# Patient Record
Sex: Female | Born: 1975 | Hispanic: Yes | Marital: Married | Smoking: Never smoker
Health system: Southern US, Community
[De-identification: ages and names within clinical notes are randomized; demographics above are authoritative.]

## PROBLEM LIST (undated history)

## (undated) DIAGNOSIS — F419 Anxiety disorder, unspecified: Secondary | ICD-10-CM

## (undated) DIAGNOSIS — N858 Other specified noninflammatory disorders of uterus: Secondary | ICD-10-CM

## (undated) DIAGNOSIS — G43909 Migraine, unspecified, not intractable, without status migrainosus: Secondary | ICD-10-CM

## (undated) DIAGNOSIS — D649 Anemia, unspecified: Secondary | ICD-10-CM

## (undated) DIAGNOSIS — E559 Vitamin D deficiency, unspecified: Secondary | ICD-10-CM

## (undated) HISTORY — DX: Vitamin D deficiency, unspecified: E55.9

## (undated) HISTORY — DX: Migraine, unspecified, not intractable, without status migrainosus: G43.909

## (undated) HISTORY — DX: Anxiety disorder, unspecified: F41.9

## (undated) HISTORY — DX: Other specified noninflammatory disorders of uterus: N85.8

## (undated) HISTORY — PX: INTRAUTERINE DEVICE (IUD) INSERTION: SHX5877

## (undated) HISTORY — DX: Anemia, unspecified: D64.9

---

## 2000-04-22 ENCOUNTER — Inpatient Hospital Stay (HOSPITAL_COMMUNITY): Admission: AD | Admit: 2000-04-22 | Discharge: 2000-04-22 | Payer: Self-pay | Admitting: Obstetrics

## 2000-04-22 ENCOUNTER — Encounter: Payer: Self-pay | Admitting: Obstetrics

## 2000-04-23 ENCOUNTER — Ambulatory Visit (HOSPITAL_COMMUNITY): Admission: RE | Admit: 2000-04-23 | Discharge: 2000-04-23 | Payer: Self-pay | Admitting: Obstetrics & Gynecology

## 2000-04-23 ENCOUNTER — Encounter (INDEPENDENT_AMBULATORY_CARE_PROVIDER_SITE_OTHER): Payer: Self-pay

## 2000-11-26 ENCOUNTER — Other Ambulatory Visit: Admission: RE | Admit: 2000-11-26 | Discharge: 2000-11-26 | Payer: Self-pay | Admitting: *Deleted

## 2001-06-18 ENCOUNTER — Inpatient Hospital Stay (HOSPITAL_COMMUNITY): Admission: AD | Admit: 2001-06-18 | Discharge: 2001-06-20 | Payer: Self-pay | Admitting: *Deleted

## 2003-03-12 ENCOUNTER — Emergency Department (HOSPITAL_COMMUNITY): Admission: EM | Admit: 2003-03-12 | Discharge: 2003-03-12 | Payer: Self-pay

## 2003-03-17 ENCOUNTER — Other Ambulatory Visit: Admission: RE | Admit: 2003-03-17 | Discharge: 2003-03-17 | Payer: Self-pay | Admitting: Obstetrics and Gynecology

## 2003-10-23 ENCOUNTER — Inpatient Hospital Stay (HOSPITAL_COMMUNITY): Admission: AD | Admit: 2003-10-23 | Discharge: 2003-10-25 | Payer: Self-pay | Admitting: Obstetrics and Gynecology

## 2005-06-02 ENCOUNTER — Encounter (INDEPENDENT_AMBULATORY_CARE_PROVIDER_SITE_OTHER): Payer: Self-pay | Admitting: Internal Medicine

## 2005-06-02 ENCOUNTER — Ambulatory Visit: Payer: Self-pay | Admitting: Internal Medicine

## 2006-01-20 ENCOUNTER — Ambulatory Visit: Payer: Self-pay | Admitting: Internal Medicine

## 2006-02-23 ENCOUNTER — Ambulatory Visit: Payer: Self-pay | Admitting: Internal Medicine

## 2006-04-24 ENCOUNTER — Encounter (INDEPENDENT_AMBULATORY_CARE_PROVIDER_SITE_OTHER): Payer: Self-pay | Admitting: Internal Medicine

## 2006-06-01 ENCOUNTER — Ambulatory Visit: Payer: Self-pay | Admitting: Hospitalist

## 2006-06-01 ENCOUNTER — Encounter (INDEPENDENT_AMBULATORY_CARE_PROVIDER_SITE_OTHER): Payer: Self-pay | Admitting: Internal Medicine

## 2006-06-01 LAB — CONVERTED CEMR LAB
ALT: 9 units/L (ref 0–35)
AST: 13 units/L (ref 0–37)
Albumin: 4.4 g/dL (ref 3.5–5.2)
Alkaline Phosphatase: 45 units/L (ref 39–117)
BUN: 11 mg/dL (ref 6–23)
Basophils Absolute: 0 10*3/uL (ref 0.0–0.1)
Basophils Relative: 0 % (ref 0–1)
CO2: 24 meq/L (ref 19–32)
Calcium: 8.9 mg/dL (ref 8.4–10.5)
Chlamydia, DNA Probe: NEGATIVE
Chloride: 107 meq/L (ref 96–112)
Creatinine, Ser: 0.62 mg/dL (ref 0.40–1.20)
Eosinophils Absolute: 0.2 10*3/uL (ref 0.0–0.7)
Eosinophils Relative: 2 % (ref 0–5)
GC Probe Amp, Genital: NEGATIVE
Glucose, Bld: 89 mg/dL (ref 70–99)
HCT: 34.2 % — ABNORMAL LOW (ref 36.0–46.0)
Hemoglobin: 11.7 g/dL — ABNORMAL LOW (ref 12.0–15.0)
Lymphocytes Relative: 29 % (ref 12–46)
Lymphs Abs: 2.8 10*3/uL (ref 0.7–3.3)
MCHC: 34.2 g/dL (ref 30.0–36.0)
MCV: 82.6 fL (ref 78.0–100.0)
Monocytes Absolute: 0.7 10*3/uL (ref 0.2–0.7)
Monocytes Relative: 7 % (ref 3–11)
Neutro Abs: 5.9 10*3/uL (ref 1.7–7.7)
Neutrophils Relative %: 61 % (ref 43–77)
Platelets: 317 10*3/uL (ref 150–400)
Potassium: 3.6 meq/L (ref 3.5–5.3)
RBC: 4.14 M/uL (ref 3.87–5.11)
RDW: 13 % (ref 11.5–14.0)
Sodium: 140 meq/L (ref 135–145)
Total Bilirubin: 0.4 mg/dL (ref 0.3–1.2)
Total Protein: 7.1 g/dL (ref 6.0–8.3)
WBC: 9.7 10*3/uL (ref 4.0–10.5)

## 2006-06-03 LAB — CONVERTED CEMR LAB
Candida species: NEGATIVE
Gardnerella vaginalis: POSITIVE — AB
Trichomonal Vaginitis: NEGATIVE

## 2007-06-03 ENCOUNTER — Encounter (INDEPENDENT_AMBULATORY_CARE_PROVIDER_SITE_OTHER): Payer: Self-pay | Admitting: Internal Medicine

## 2007-06-03 LAB — CONVERTED CEMR LAB: Pap Smear: NORMAL

## 2007-08-06 ENCOUNTER — Ambulatory Visit: Payer: Self-pay | Admitting: Hospitalist

## 2007-08-06 DIAGNOSIS — K089 Disorder of teeth and supporting structures, unspecified: Secondary | ICD-10-CM | POA: Insufficient documentation

## 2007-09-10 ENCOUNTER — Ambulatory Visit: Payer: Self-pay | Admitting: Internal Medicine

## 2008-06-22 ENCOUNTER — Encounter (INDEPENDENT_AMBULATORY_CARE_PROVIDER_SITE_OTHER): Payer: Self-pay | Admitting: Internal Medicine

## 2008-08-16 LAB — HM MAMMOGRAPHY

## 2008-08-28 ENCOUNTER — Ambulatory Visit: Payer: Self-pay | Admitting: Infectious Disease

## 2008-08-28 DIAGNOSIS — F411 Generalized anxiety disorder: Secondary | ICD-10-CM

## 2008-08-30 ENCOUNTER — Encounter (INDEPENDENT_AMBULATORY_CARE_PROVIDER_SITE_OTHER): Payer: Self-pay | Admitting: Internal Medicine

## 2008-09-27 ENCOUNTER — Ambulatory Visit: Payer: Self-pay | Admitting: Internal Medicine

## 2009-06-04 ENCOUNTER — Ambulatory Visit: Payer: Self-pay | Admitting: Internal Medicine

## 2009-09-03 ENCOUNTER — Encounter: Payer: Self-pay | Admitting: Internal Medicine

## 2009-10-11 ENCOUNTER — Ambulatory Visit: Payer: Self-pay | Admitting: Internal Medicine

## 2009-10-16 ENCOUNTER — Ambulatory Visit: Payer: Self-pay | Admitting: Internal Medicine

## 2009-10-16 LAB — CONVERTED CEMR LAB
ALT: 12 units/L (ref 0–35)
AST: 12 units/L (ref 0–37)
Alkaline Phosphatase: 44 units/L (ref 39–117)
Chloride: 105 meq/L (ref 96–112)
Creatinine, Ser: 0.72 mg/dL (ref 0.40–1.20)
HCT: 35.8 % — ABNORMAL LOW (ref 36.0–46.0)
Hgb A1c MFr Bld: 5.6 %
LDL Cholesterol: 60 mg/dL (ref 0–99)
MCV: 83.1 fL (ref >=78.0)
Platelets: 295 10*3/uL (ref 150–400)
RDW: 13.9 % (ref 11.5–15.5)
TSH: 2.005 microintl units/mL (ref 0.350–4.5)
Total Bilirubin: 0.5 mg/dL (ref 0.3–1.2)
Total CHOL/HDL Ratio: 2.7
VLDL: 19 mg/dL (ref 0–40)

## 2010-05-07 ENCOUNTER — Emergency Department (HOSPITAL_COMMUNITY)
Admission: EM | Admit: 2010-05-07 | Discharge: 2010-05-07 | Payer: Self-pay | Source: Home / Self Care | Admitting: Family Medicine

## 2010-06-04 NOTE — Assessment & Plan Note (Signed)
Summary: PT COMING @415  FOR FLU SHOT/CH  Nurse Visit   Allergies: 1)  ! Penicillin  Immunizations Administered:  Influenza Vaccine # 1:    Vaccine Type: Fluvax Non-MCR    Site: left deltoid    Mfr: Novartis    Dose: 0.5 ml    Route: IM    Given by: Angelina Ok RN    Exp. Date: 08/2009    Lot #: [id number]    VIS given: 11/26/06 version given June 04, 2009.  Flu Vaccine Consent Questions:    Do you have a history of severe allergic reactions to this vaccine? no    Any prior history of allergic reactions to egg and/or gelatin? no    Do you have a sensitivity to the preservative Thimersol? no    Do you have a past history of Guillan-Barre Syndrome? no    Do you currently have an acute febrile illness? no    Have you ever had a severe reaction to latex? no    Vaccine information given and explained to patient? yes    Are you currently pregnant? no  Orders Added: 1)  Influenza Vaccine NON MCR [00028]

## 2010-06-04 NOTE — Assessment & Plan Note (Signed)
Summary: RA/NEEDS YEARLY CHECKUP/CH   Vital Signs:  Patient profile:   35 year old female Height:      59 inches (149.86 cm) Weight:      118.3 pounds (53.77 kg) BMI:     23.98 Temp:     98.3 degrees F (36.83 degrees C) oral Pulse rate:   82 / minute BP sitting:   100 / 59  (right arm) Cuff size:   regular  Vitals Entered By: Cynda Familia Duncan Dull) (October 11, 2009 3:55 PM) CC: "yearly check-up" Is Patient Diabetic? No Pain Assessment Patient in pain? no      Nutritional Status BMI of 19 -24 = normal  Have you ever been in a relationship where you felt threatened, hurt or afraid?No   Does patient need assistance? Functional Status Self care Ambulation Normal   Primary Care Provider:  Peggye Pitt MD  CC:  "yearly check-up".  History of Present Illness: 35 yr old woman with pmhx as described below comes to the clinic for regular check up. Patient reports to have episodes of chest tightness that are experienced 1-2 times per week. Associated with shortness of breath, and  palpitations. Episodes last for around 2 minutes. Patient feels like she is going to die when she gets episodes. Patient had these episodes in the past which got better while she was taking paroxetine. Patient stopped taking paroxetine 6 months ago. Episodes restarted 4 months ago. Patient states that she was walks atleast 3 times per week but never gets pain on walking. It is always at rest. Patient worries alot and reports that she is in stress. She has 3 children and she feels stressed out because she does not have any time for herself. Denies smoking, alcohol or drug use.   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 Take 1 Tablet By Mouth Once A Day  Current Medications (verified): 1)  Zoloft 25 Mg Tabs (Sertraline Hcl) .... Take 1 Tablet By Mouth Once A Day  Allergies: 1)  ! Penicillin  Past History:  Past Medical History: Last updated: 06/01/2006 Headache, migraine  Risk Factors: Exercise: yes (08/28/2008)  Risk Factors: Smoking Status: never (10/11/2009)  Review of Systems  The patient denies fever, headaches, hemoptysis, abdominal pain, melena, hematochezia, severe indigestion/heartburn, hematuria, muscle weakness, difficulty walking, and unusual weight change.    Physical Exam  General:  Well-developed,well-nourished,in no acute distress; alert,appropriate and cooperative throughout examination Mouth:  Oral mucosa and oropharynx without lesions or exudates.  Teeth in good repair. Neck:  No bruits/goiter Lungs:  Normal respiratory effort, chest expands symmetrically. Lungs are clear to auscultation, no crackles or wheezes. Heart:  Normal rate and regular rhythm. S1 and S2 normal without gallop, murmur, click, rub or other extra sounds. Abdomen:  Bowel sounds positive,abdomen soft and non-tender without masses, organomegaly or hernias noted. Msk:  normal ROM.   Extremities:  No clubbing, cyanosis, edema. Neurologic:  alert & oriented X3, cranial nerves II-XII intact, strength normal in all extremities, sensation intact to light touch, sensation intact to pinprick, gait normal, and DTRs symmetrical and normal.   Psych:  moderately anxious.     Impression & Recommendations:  Problem # 1:  GENERALIZED ANXIETY DISORDER (ICD-300.02) Chest pain  most likely related to panic attack 2/2 GAD. No risk factors for CAD. Will start patient on Zoloft. Reasses in one month.   The following medications were removed from the medication list:    Paroxetine Hcl 20 Mg Tabs (Paroxetine hcl) .Marland Kitchen... Take 1 tablet by mouth once a day Her updated medication list for this problem  includes:    Zoloft 25 Mg Tabs (Sertraline hcl) .Marland Kitchen... Take 1 tablet by mouth once a day  Orders: T-CMP with Estimated GFR ([id number]) T-Comprehensive Metabolic Panel ([id number])Future Orders: T-TSH ([id number]) ... 10/12/2009 T-CBC No Diff ([id number]) ... 10/12/2009  Problem # 2:  HEALTH SCREENING (ICD-V70.0) Check labs and reasses. Patient had normal Pap smear at [facility]. Will get records.   Complete Medication List: 1)  Zoloft 25 Mg Tabs (Sertraline hcl) .... Take 1 tablet by mouth once a day  Other Orders: Future Orders: T-Lipid Profile ([id number]) ... 10/12/2009 T-Hgb A1C (in-house) [id number]) ... 10/12/2009  Patient Instructions: 1)  Please schedule a follow-up appointment in 2 months. 2)  You will be called with any abnormalities in the tests scheduled or performed today.  If you don't hear from [place] within a week from when the test was performed, you can assume that your test was normal.  Prescriptions: ZOLOFT 25 MG TABS (SERTRALINE HCL) Take 1 tablet by mouth once a day  #30 x 3   Entered and Authorized by:   Laren Everts MD   Signed by:   Laren Everts MD on 10/11/2009   Method used:   Print then Give to Patient   RxID:   [id number]  Process Orders Check Orders Results:     [facility]: ABN not required for this insurance Tests Sent for requisitioning (October 16, 2009 2:28 PM):     10/12/2009: [facility] -- T-Lipid Profile [phone] (signed)     10/12/2009: [facility] -- T-TSH [phone] (signed)     10/12/2009: [facility] -- T-CBC No Diff [[id number]] (signed)     10/11/2009: [facility] -- T-Comprehensive Metabolic Panel [phone] (signed)    Process Orders Check Orders Results:     [facility]: ABN not required for this insurance Tests Sent for requisitioning (October 16, 2009 2:28 PM):     10/12/2009:  [facility] -- T-Lipid Profile [phone] (signed)     10/12/2009: [facility] -- T-TSH [phone] (signed)     10/12/2009: [facility] -- T-CBC No Diff [[id number]] (signed)     10/11/2009: [facility] -- T-Comprehensive Metabolic Panel [phone] (signed)

## 2010-06-04 NOTE — Letter (Signed)
[facility]   Imported By: Margie Billet 10/19/2009 10:56:27  _____________________________________________________________________  External Attachment:    Type:   Image     Comment:   External Document  Appended Document: [facility] Pap smear normal.

## 2010-09-14 ENCOUNTER — Encounter: Payer: Self-pay | Admitting: Internal Medicine

## 2010-09-20 NOTE — Op Note (Signed)
[facility]  Patient:    Lisa Villarreal, Lisa Villarreal Visit Number: [id number] MRN: [id number]          Service Type: OBS Location: [id number] Attending Physician:  Michaelle Copas Dictated by:   Lenoard Aden, M.D. Admit Date:  06/18/2001                             Operative Report  INDICATIONS:                  Indications for operative vaginal delivery was nonreassuring fetal heart rate tracing with persistent variable/deep decelerations after prolonged expulsive efforts noted, and due to the aforementioned indications, a Mityvac mushroom cup is offered to the patient and her husband.  The risks and benefits of vacuum-assisted vaginal delivery discussed.  They acknowledge and desired [place] to proceed.  DELIVERY NOTE:                Under appropriate epidural anesthesia, after emptying the bladder with the red rubber catheter, a Mityvac mushroom cup is placed to the vertex, left occiput anterior position, less than 45 degrees at +3 station for four pulls over a central median episiotomy of a full-term living female, Apgars 8 and 9.  Mild shoulder dystocia relieved with Roberts maneuver and suprapubic pressure.  Placenta was delivered spontaneously intact. Three-vessel cord noted.  Cervix is inspected and found to be without lacerations.  A small right lateral vaginal wall laceration was repaired using 2-0 Vicryl repeat.  Episiotomy repaired using a 2-0 repeat in the standard fashion.  Good hemostasis is noted.  Mother and baby are doing well and recovery in good condition. Dictated by:   Lenoard Aden, M.D. Attending Physician:  Michaelle Copas DD:  06/19/01 TD:  06/19/01 Job: [id number]

## 2010-09-20 NOTE — Op Note (Signed)
[facility]  Patient:    Lisa Villarreal, Lisa Villarreal                       MRN: [id number] Proc. Date: 04/24/00 Adm. Date:  [id number] Disc. Date: [id number] Attending:  Antionette Char CC:         [facility]   Operative Report  PREOPERATIVE DIAGNOSIS:       Embryotic demise.  POSTOPERATIVE DIAGNOSIS:      Embryotic demise.  PROCEDURE:                    Suction dilation and curettage.  SURGEON:                      Roseanna Rainbow, M.D.  ANESTHESIA:                   Paracervical block, managed anesthesia care.  COMPLICATIONS:                None.  ESTIMATED BLOOD LOSS:         50 cc.  FINDINGS:                     Eight week size anteverted uterus.  Moderate products of conception.  DESCRIPTION OF PROCEDURE:     The patient was taken to the operating room.  A ______ speculum was placed in the patients vagina.  The cervix and vagina were swabbed with Betadine, and 2 cc of lidocaine were then injected into the anterior lip of the cervix.  Lidocaine 2 cc were then injected in all four quadrants to produce a paracervical block.  A total of 10 cc was used in all. The single-tooth tenaculum was applied to the anterior lip of the cervix.  A 7 mm suction curette was then advanced gently into the uterine fundus after the cervix had been dilated with Shawnie Pons dilators.  The suction device was then activated and the curette rotated to clear the uterus of the products of conception.  A sharp curettage was then performed until a gritty texture was noted.  The suction curette was then introduced to clear the uterus of the remaining products of conception.  There was minimal bleeding noted and the tenaculum removed with good hemostasis.  The patient tolerated the procedure well.  The patient was taken to the PACU in stable condition.  PATHOLOGY:                    Products of conception. DD:  04/24/00 TD:  04/26/00 Job:  [id number]

## 2010-10-24 ENCOUNTER — Ambulatory Visit (INDEPENDENT_AMBULATORY_CARE_PROVIDER_SITE_OTHER): Payer: Self-pay | Admitting: Internal Medicine

## 2010-10-24 ENCOUNTER — Encounter: Payer: Self-pay | Admitting: Internal Medicine

## 2010-10-24 DIAGNOSIS — Z0000 Encounter for general adult medical examination without abnormal findings: Secondary | ICD-10-CM

## 2010-10-24 DIAGNOSIS — B351 Tinea unguium: Secondary | ICD-10-CM | POA: Insufficient documentation

## 2010-10-24 LAB — COMPREHENSIVE METABOLIC PANEL
ALT: 13 U/L (ref 0–35)
AST: 16 U/L (ref 0–37)
Creat: 0.7 mg/dL (ref 0.50–1.10)
Total Bilirubin: 0.4 mg/dL (ref 0.3–1.2)

## 2010-10-24 LAB — CBC
MCV: 82.4 fL (ref 78.0–100.0)
Platelets: 336 10*3/uL (ref 150–400)
RDW: 13.4 % (ref 11.5–15.5)
WBC: 9.5 10*3/uL (ref 4.0–10.5)

## 2010-10-24 LAB — LIPID PANEL
Cholesterol: 152 mg/dL (ref 0–200)
LDL Cholesterol: 55 mg/dL (ref 0–99)
Triglycerides: 248 mg/dL — ABNORMAL HIGH (ref <150)

## 2010-10-24 MED ORDER — TERBINAFINE HCL 250 MG PO TABS: 250.0000 mg | ORAL_TABLET | Freq: Every day | ORAL | Status: AC

## 2010-10-24 NOTE — Patient Instructions (Signed)
Please make a follow up appointment in 3 months. Regrese in 3 mezes. No puede tomar bebidas al mismo tiempo que esta tomando nuevo medicamiento.

## 2010-10-25 ENCOUNTER — Encounter: Payer: Self-pay | Admitting: Internal Medicine

## 2010-10-25 NOTE — Assessment & Plan Note (Signed)
Will start patient on 3 month course of lamisil for onychomycosis. Patient denies any alcohol consumption. Will check liver enzymes. Patient was educated on the risk of drinking alcohol while taking medication. Patient voices understanding. Will have patient follow up in 3 months to recheck liver enzymes.

## 2010-10-25 NOTE — Assessment & Plan Note (Signed)
Made a copy of Pap smear for our records. Check lipid panel although not fasting, and TSH.

## 2010-10-25 NOTE — Progress Notes (Signed)
  Subjective:    Patient ID: Lisa Villarreal, female    DOB: 1975/05/11, 35 y.o.   MRN: [id number]  HPI  35 yr old woman with no significant past medical history comes to the clinic for annual exam. Patient has no complains other than fungal infection on toes.   Patient reports to have gone to [facility] and got a Pap smear which was normal. Patient brought a copy of pap smear today.    Review of Systems  All other systems reviewed and are negative.       Objective:   Physical Exam  Constitutional: She is oriented to person, place, and time. She appears well-developed and well-nourished.  HENT:  Mouth/Throat: Oropharynx is clear and moist.  Eyes: Conjunctivae and EOM are normal. Pupils are equal, round, and reactive to light.  Neck: Normal range of motion. Neck supple.  Cardiovascular: Normal rate, regular rhythm and normal heart sounds.   Pulmonary/Chest: Effort normal and breath sounds normal.  Abdominal: Soft. Bowel sounds are normal.  Musculoskeletal: Normal range of motion.  Neurological: She is alert and oriented to person, place, and time. No cranial nerve deficit. Coordination normal.  Psychiatric: She has a normal mood and affect.          Assessment & Plan:

## 2011-03-06 ENCOUNTER — Encounter: Payer: Self-pay | Admitting: Internal Medicine

## 2011-03-20 ENCOUNTER — Ambulatory Visit (INDEPENDENT_AMBULATORY_CARE_PROVIDER_SITE_OTHER): Payer: Self-pay | Admitting: Internal Medicine

## 2011-03-20 ENCOUNTER — Encounter: Payer: Self-pay | Admitting: Internal Medicine

## 2011-03-20 VITALS — BP 116/65 | HR 82 | Temp 97.5°F | Ht 59.5 in | Wt 114.5 lb

## 2011-03-20 DIAGNOSIS — G5700 Lesion of sciatic nerve, unspecified lower limb: Secondary | ICD-10-CM | POA: Insufficient documentation

## 2011-03-20 DIAGNOSIS — Z23 Encounter for immunization: Secondary | ICD-10-CM

## 2011-03-20 DIAGNOSIS — Z79899 Other long term (current) drug therapy: Secondary | ICD-10-CM

## 2011-03-20 DIAGNOSIS — F1910 Other psychoactive substance abuse, uncomplicated: Secondary | ICD-10-CM

## 2011-03-20 MED ORDER — IBUPROFEN 800 MG PO TABS: 800.0000 mg | ORAL_TABLET | Freq: Three times a day (TID) | ORAL | Status: AC | PRN

## 2011-03-20 NOTE — Assessment & Plan Note (Signed)
She complains of pain in her deep right gluteal muscle and right lower back, that extends down her right quadriceps. This began intermittently approximately 2 months ago but over the past 3 weeks has become continuous.  It has been unresponsive to 400 mg of Advil.  Symptoms on physical exam is consistent with piriformis syndrome. I will increase ibuprofen to 800 mg Q8 hours when necessary for pain and schedule referral to physical therapy. I gave printed instructions on exercises to help alleviate the discomfort from the piriformis syndrome. If this resolves her pain she will followup with me for annual physical exam in May 2013.

## 2011-03-20 NOTE — Patient Instructions (Addendum)
It was nice to meet you today.  You received the flu vaccination today. We will check your kidney function today as you requested.  If there is an abnormality we will contact you.You likely have piriformis syndrome.  Please see below for further details.  You can take ibuprofen every 8 hours as needed for the pain.  We will schedule an appointment with physical therapy for you. Please return to see me next May 2013 for you annual examination.  Piriformis Syndrome Piriformis syndrome is a rare neuromuscular disorder. It happens when the piriformis muscle compresses or irritates the sciatic nerve. This is the largest nerve in the body. The piriformis muscle is a narrow muscle. It is located in the buttocks.  SYMPTOMS  Compression of the sciatic nerve causes pain. It is often described as tingling or numbness:  In the buttocks.   Along the nerve.   Down to the leg.  The pain may get worse as a result of:  Sitting for a long period of time.   Climbing stairs.   Walking or running.  TREATMENT  Generally, treatment for the disorder begins with stretching exercises and massage. Anti-inflammatory drugs may be prescribed. A patient may be advised to stop running, bicycling, or similar activities. A corticosteroid injection near where the piriformis muscle and the sciatic nerve meet may provide temporary relief. In some cases, surgery is recommended. The outcome for most individuals with this syndrome is good. Once symptoms of the disorder are addressed, individuals can usually resume their normal activities. In some cases, exercise regimens may need to be modified. This is in order to reduce the likelihood of recurrence or worsening. Document Released: 04/11/2002 Document Revised: 01/01/2011 Document Reviewed: 04/21/2005  Piriformis Syndrome with Rehab Piriformis syndrome is a condition the affects the nervous system in the area of the hip, and is characterized by pain and possibly a loss of  feeling in the backside (posterior) thigh that may extend down the entire length of the leg. The symptoms are caused by an increase in pressure on the sciatic nerve by the piriformis muscle, which is on the back of the hip and is responsible for externally rotating the hip. The sciatic nerve and its branches connect to much of the leg. Normally the sciatic nerve runs between the piriformis muscle and other muscles. However, in certain individuals the nerve runs through the muscle, which causes an increase in pressure on the nerve and results in the symptoms of piriformis syndrome. SYMPTOMS   Pain, tingling, numbness, or burning in the back of the thigh that may also extend down the entire leg.   Occasionally, tenderness in the buttock.   Loss of function of the leg.   Pain that worsens when using the piriformis muscle (running, jumping, or stairs).   Pain that increases with prolonged sitting.   Pain that is lessened by laying flat on the back.  CAUSES   Piriformis syndrome is the result of an increase in pressure placed on the sciatic nerve. Often times piriformis syndrome is an overuse injury.   Stress placed on the nerve from a sudden increase in the intensity, frequency, or duration of training.   Compensation of other extremity injuries.  RISK INCREASES WITH:  Sports that involve the piriformis muscle (running, walking or jumping).   You are born with (congenital) a defect in which the sciatic nerve passes through the muscle.  PREVENTION  Warm up and stretch properly before activity.   Allow for adequate recovery between workouts.  Maintain physical fitness:   Strength, flexibility, and endurance.   Cardiovascular fitness.  PROGNOSIS  If treated properly, then the symptoms of piriformis syndrome usually resolve in 2 to 6 weeks. RELATED COMPLICATIONS   Persistent and possibly permanent pain and numbness in the lower extremity.   Weakness of the extremity that may  progress to disability and inability to compete.  TREATMENT  The most effective treatment for piriformis syndrome is rest from any activities that aggravate the symptoms. Ice and pain medication may help reduce pain and inflammation. The use of strengthening and stretching exercises may help reduce pain with activity. These exercises may be performed at home or with a therapist. A referral to a therapist may be given for further evaluation and treatment, such as ultrasound. Corticosteroid injections may be given to reduce inflammation that is causing pressure to be placed on the sciatic nerve. If non-surgical (conservative) treatment is unsuccessful, then surgery may be recommended.  MEDICATION   If pain medication is necessary, then nonsteroidal anti-inflammatory medications, such as aspirin and ibuprofen, or other minor pain relievers, such as acetaminophen, are often recommended.   Do not take pain medication for 7 days before surgery.   Prescription pain relievers may be given if deemed necessary by your caregiver. Use only as directed and only as much as you need.   Corticosteroid injections may be given by your caregiver. These injections should be reserved for the most serious cases, because they may only be given a certain number of times.  HEAT AND COLD:   Cold treatment (icing) relieves pain and reduces inflammation. Cold treatment should be applied for 10 to 15 minutes every 2 to 3 hours for inflammation and pain and immediately after any activity that aggravates your symptoms. Use ice packs or massage the area with a piece of ice (ice massage).   Heat treatment may be used prior to performing the stretching and strengthening activities prescribed by your caregiver, physical therapist, or athletic trainer. Use a heat pack or soak the injury in warm water.  SEEK IMMEDIATE MEDICAL CARE IF:  Treatment seems to offer no benefit, or the condition worsens.   Any medications produce adverse  side effects.  EXERCISES RANGE OF MOTION (ROM) AND STRETCHING EXERCISES - Piriformis Syndrome These exercises may help you when beginning to rehabilitate your injury. Your symptoms may resolve with or without further involvement from your physician, physical therapist or athletic trainer. While completing these exercises, remember:   Restoring tissue flexibility helps normal motion to return to the joints. This allows healthier, less painful movement and activity.   An effective stretch should be held for at least 30 seconds.   A stretch should never be painful. You should only feel a gentle lengthening or release in the stretched tissue.  STRETCH - Hip Rotators  Lie on your back on a firm surface. Grasp your right / left knee with your right / left hand and your ankle with your opposite hand.   Keeping your hips and shoulders firmly planted, gently pull your right / left knee and rotate your lower leg toward your opposite shoulder until you feel a stretch in your buttocks.   Hold this stretch for __________ seconds.  Repeat this stretch __________ times. Complete this stretch __________ times per day. STRETCH - Iliotibial Band  On the floor or bed, lie on your side so your right / left leg is on top. Bend your knee and grab your ankle.   Slowly bring your knee back  so that your thigh is in line with your trunk. Keep your heel at your buttocks and gently arch your back so your head, shoulders and hips line up.   Slowly lower your leg so that your knee approaches the floor/bed until you feel a gentle stretch on the outside of your right / left thigh. If you do not feel a stretch and your knee will not fall farther, place the heel of your opposite foot on top of your knee and pull your thigh down farther.   Hold this stretch for __________ seconds.  Repeat __________ times. Complete __________ times per day. STRENGTHENING EXERCISES - Piriformis Syndrome  These are some of the caregiver  again or until your symptoms are resolved. Remember:   Strong muscles with good endurance tolerate stress better.   Do the exercises as initially prescribed by your caregiver. Progress slowly with each exercise, gradually increasing the number of repetitions and weight used under their guidance.  STRENGTH - Hip Abductors, Straight Leg Raises Be aware of your form throughout the entire exercise so that you exercise the correct muscles. Sloppy form means that you are not strengthening the correct muscles.  Lie on your side so that your head, shoulders, knee and hip line up. You may bend your lower knee to help maintain your balance. Your right / left leg should be on top.   Roll your hips slightly forward, so that your hips are stacked directly over each other and your right / left knee is facing forward.   Lift your top leg up 4-6 inches, leading with your heel. Be sure that your foot does not drift forward or that your knee does not roll toward the ceiling.   Hold this position for __________ seconds. You should feel the muscles in your outer hip lifting (you may not notice this until your leg begins to tire).   Slowly lower your leg to the starting position. Allow the muscles to fully relax before beginning the next repetition.  Repeat __________ times. Complete this exercise __________ times per day.  STRENGTH - Hip Abductors, Quadriped  On a firm, lightly padded surface, position yourself on your hands and knees. Your hands should be directly below your shoulders and your knees should be directly below your hips.   Keeping your right / left knee bent, lift your leg out to the side. Keep your legs level and in line with your shoulders.   Position yourself on your hands and knees.   Hold for __________ seconds.   Keeping your trunk steady and your hips level, slowly lower your leg to the starting position.  Repeat __________ times. Complete this exercise __________ times per day.    STRENGTH - Hip Abductors, Standing  Tie one end of a rubber exercise band/tubing to a secure surface (table, pole) and tie a loop at the other end.   Place the loop around your right / left ankle. Keeping your ankle with the band directly opposite of the secured end, step away until there is tension in the tube/band.   Hold onto a chair as needed for balance.   Keeping your back upright, your shoulders over your hips, and your toes pointing forward, lift your right / left leg out to your side. Be sure to lift your leg with your hip muscles. Do not "throw" your leg or tip your body to lift your leg.   Slowly and with control, return to the starting position.  Repeat exercise __________ times. Complete  this exercise __________ times per day.  Document Released: 04/21/2005 Document Revised: 01/01/2011 Document Reviewed: 08/03/2008 [facility] Patient Information 2012 [place], [place]. [employer] Patient Information 2012 [place], [place].

## 2011-03-20 NOTE — Progress Notes (Signed)
  Subjective:    Patient ID: Lisa Villarreal, female    DOB: [birth year], 36 y.o.   MRN: [id number]  HPI Lisa Villarreal presents today with complaints of pain that began in her deep right buttocks and right lower back approximately 2 months ago. She reports that the pain was initially intermittent but for the past 3 weeks it is a daily occurrence.  The pain is associated with tingling and extends along her right quadriceps down to her right knee. She has tried 400 mg of Advil without alleviation of her symptoms. Denies long periods of sitting, trauma,  fever, syncope, or incontinence.   Review of Systems As per history of present illness otherwise negative    Objective:   Physical Exam  Constitutional: She appears well-developed and well-nourished. No distress.  Cardiovascular: Normal rate, regular rhythm, normal heart sounds and intact distal pulses.   Pulmonary/Chest: Effort normal and breath sounds normal.  Musculoskeletal: She exhibits tenderness.       Right upper leg: She exhibits tenderness. She exhibits no bony tenderness, no swelling, no edema and no deformity.       Legs:         Assessment & Plan:  #1 piriformis syndrome: Increase ibuprofen to 800 mg Q8 hours and referred to physical therapy. Patient was given stretching exercises for piriformis syndrome.

## 2011-03-21 LAB — COMPLETE METABOLIC PANEL WITH GFR
ALT: 11 U/L (ref 0–35)
Albumin: 4.6 g/dL (ref 3.5–5.2)
CO2: 24 mEq/L (ref 19–32)
Calcium: 9.6 mg/dL (ref 8.4–10.5)
Chloride: 104 mEq/L (ref 96–112)
Creat: 0.73 mg/dL (ref 0.50–1.10)
GFR, Est African American: >89 mL/min
Glucose, Bld: 86 mg/dL (ref 70–99)
Total Bilirubin: 0.4 mg/dL (ref 0.3–1.2)

## 2011-04-03 ENCOUNTER — Ambulatory Visit: Payer: Self-pay | Admitting: Physical Therapy

## 2011-04-07 ENCOUNTER — Ambulatory Visit: Payer: Self-pay | Attending: Internal Medicine | Admitting: Physical Therapy

## 2011-04-07 DIAGNOSIS — IMO0001 Reserved for inherently not codable concepts without codable children: Secondary | ICD-10-CM | POA: Insufficient documentation

## 2011-04-07 DIAGNOSIS — M545 Low back pain, unspecified: Secondary | ICD-10-CM | POA: Insufficient documentation

## 2011-04-07 DIAGNOSIS — M25559 Pain in unspecified hip: Secondary | ICD-10-CM | POA: Insufficient documentation

## 2011-04-08 ENCOUNTER — Ambulatory Visit: Payer: Self-pay | Admitting: Physical Therapy

## 2011-04-09 ENCOUNTER — Ambulatory Visit: Payer: Self-pay | Admitting: Rehabilitative and Restorative Service Providers"

## 2011-04-10 ENCOUNTER — Ambulatory Visit: Payer: Self-pay | Admitting: Physical Therapy

## 2011-04-16 ENCOUNTER — Ambulatory Visit: Payer: Self-pay | Admitting: Physical Therapy

## 2011-04-21 ENCOUNTER — Ambulatory Visit: Payer: Self-pay | Admitting: Physical Therapy

## 2011-04-23 ENCOUNTER — Ambulatory Visit: Payer: Self-pay | Admitting: Physical Therapy

## 2011-05-01 ENCOUNTER — Ambulatory Visit: Payer: Self-pay | Admitting: Physical Therapy

## 2011-11-17 ENCOUNTER — Encounter: Payer: Self-pay | Admitting: Internal Medicine

## 2011-11-17 ENCOUNTER — Ambulatory Visit (INDEPENDENT_AMBULATORY_CARE_PROVIDER_SITE_OTHER): Payer: Self-pay | Admitting: Internal Medicine

## 2011-11-17 VITALS — BP 107/60 | HR 83 | Temp 98.0°F | Ht 60.0 in | Wt 111.6 lb

## 2011-11-17 DIAGNOSIS — F411 Generalized anxiety disorder: Secondary | ICD-10-CM

## 2011-11-17 DIAGNOSIS — G5700 Lesion of sciatic nerve, unspecified lower limb: Secondary | ICD-10-CM

## 2011-11-17 DIAGNOSIS — Z0000 Encounter for general adult medical examination without abnormal findings: Secondary | ICD-10-CM

## 2011-11-17 DIAGNOSIS — L819 Disorder of pigmentation, unspecified: Secondary | ICD-10-CM | POA: Insufficient documentation

## 2011-11-17 LAB — CBC
MCH: 27.8 pg (ref 26.0–34.0)
MCHC: 34.2 g/dL (ref 30.0–36.0)
Platelets: 300 10*3/uL (ref 150–400)
RBC: 4.28 MIL/uL (ref 3.87–5.11)
RDW: 13.6 % (ref 11.5–15.5)

## 2011-11-17 LAB — LIPID PANEL: HDL: 52 mg/dL (ref >39)

## 2011-11-17 LAB — BASIC METABOLIC PANEL
CO2: 22 mEq/L (ref 19–32)
Calcium: 9.5 mg/dL (ref 8.4–10.5)
Sodium: 135 mEq/L (ref 135–145)

## 2011-11-17 NOTE — Patient Instructions (Addendum)
We will make an appointment with the [facility] at the [facility].  They can review your options for tubal ligation. I will also refer you to a dermatologist for the light spots on your forearms.  These do not appear to be cancer. I would like to see you back in 1 year.

## 2011-11-17 NOTE — Progress Notes (Signed)
Subjective:     Patient ID: Lisa Villarreal, female   DOB: 1976-01-23, 36 y.o.   MRN: [id number]  HPI Patient presents today requesting consultation for tubal ligation. She states that her last Pap smear was done in February 2013 at a free clinic near the [facility]. Of note [facility] She endorses intermittent anxiety but states that she does not feel the need for medication, specifically declining resumption of Zoloft which she discontinued in July 2012.  He anxiety is centered around her health and she denies any social stressors.  For example if she has a fleeting pain in her back she questions whether or not this is chest pain that may actually be a heart attack. She has some small non-itchy papules on her bilateral forearms with a few scattered hypopigmented areas that have been present for at least 6-7 months per her report. She is concerned that this is cancer. As for her piriformis syndrome, this has resolved after physical therapy. Lastly she states that she's had a thicker vaginal discharge over several weeks but denies dysuria, itchiness, foul smell, vaginal or pelvic pain.  Review of Systems As per HPI    Objective:   Physical Exam  Constitutional: She is oriented to person, place, and time. She appears well-developed and well-nourished. No distress.  HENT:  Head: Normocephalic and atraumatic.  Eyes: Conjunctivae and EOM are normal. Pupils are equal, round, and reactive to light.  Neck: Normal range of motion. Neck supple. No thyromegaly present.  Cardiovascular: Normal rate, regular rhythm, normal heart sounds and intact distal pulses.   No murmur heard. Pulmonary/Chest: Effort normal and breath sounds normal. She has no wheezes. She exhibits no tenderness.  Abdominal: Soft. Bowel sounds are normal. She exhibits no distension. There is no tenderness.  Musculoskeletal: Normal range of motion. She exhibits no edema and no  tenderness.  Lymphadenopathy:    She has no cervical adenopathy.  Neurological: She is alert and oriented to person, place, and time.  Skin: Skin is warm and dry.  Psychiatric: She has a normal mood and affect. Her behavior is normal.       Concerned about multiple bodily issues but does not appear to be overtly anxious today       Assessment:     1. Anxiety: stable without medications 2. Hypopigmented plaques: non-cancerous appearance 3. Health maintenance    Plan:     -will check CBC, lipid panel, and Bmet today -pt declines anxiety meds or resumption of zoloft -referred to GYN for further follow-up re: tubal ligation and pelvic exam -referred to dermatology to assess forearm papules

## 2011-12-11 ENCOUNTER — Ambulatory Visit (INDEPENDENT_AMBULATORY_CARE_PROVIDER_SITE_OTHER): Payer: Self-pay | Admitting: Obstetrics & Gynecology

## 2011-12-11 ENCOUNTER — Encounter: Payer: Self-pay | Admitting: Obstetrics & Gynecology

## 2011-12-11 VITALS — BP 103/64 | HR 77 | Temp 97.9°F | Ht 58.25 in | Wt 113.0 lb

## 2011-12-11 DIAGNOSIS — Z3009 Encounter for other general counseling and advice on contraception: Secondary | ICD-10-CM

## 2011-12-11 NOTE — Progress Notes (Signed)
Subjective:     Patient ID: Lisa Villarreal, female   DOB: Feb 04, 1976, 36 y.o.   MRN: [id number]  HPI Pt is a [id number] with LMP of 11/26/11.  Pt presents with questions re sterilization.  Wants permanent sterilization. Curently using condoms.  Youngest child 20yrs old.   Does not want a prolonged recovery.    Review of Systems    N/C Objective:   Physical Exam deferred    Assessment:     Contraception counseling Reviewed with pt permanent contraception options including lap BTL, ESSURE and vasectomy.  Reviewed the risks and benefits of each. Pt want to discuss with her husband and f/u.   Does not want other contraception until speaking with spouse.    Plan:       f/u prn plans for permanent contraception Pt notified about the vasectomy scholarship  Ariane Ditullio L. Harraway-Smith, M.D., [place]

## 2011-12-11 NOTE — Patient Instructions (Signed)
Vasectoma (Vasectomy) La vasectoma es la oclusin (con o sin corte) del tubo que lleva la esperma desde el testculo (conducto deferente). La vasectoma impide que la esperma vaya hasta el conducto deferente y el pene, de modo que durante las relaciones sexuales, no llegue a la vagina. Es un procedimiento seguro, con muy pocas complicaciones. No afecta el deseo ni el desempeo sexual.  Como es un procedimiento de [employer], no debe hacerlo hasta estar seguro de que no quiere tener hijos. Usted y su pareja deben estar en un todo de acuerdo antes del procedimiento. La decisin de [employer] vasectoma no debe tomarse durante una situacin estresante. Por ejemplo en caso de prdida de un embarazo, enfermedad, muerte de la esposa o divorcio. Existen otros mtodos anticonceptivos que pueden utilizarse hasta que est completamente seguro de que quiere realizarlo. Este procedimiento no lo proteger contra las infecciones transmitidas sexualmente. Los hombres que desean que la vasectoma sea revertida, necesitan someterse a una operacin. Puede que no se obtenga un buen resultado. Tiene menos riesgos y es menos costosa que Education officer, environmental una esterilizacin tubrica en la mujer.  INFORME AL PROFESIONAL SOBRE LOS SIGUIENTES PUNTOS:  Sufre [employer] toma, incluyendo hierbas, gotas oftlmicas, medicamentos de venta libre y cremas   Uso de esteroides (por va oral o cremas)   Problemas anteriores debido a anestsicos o a Patent examiner.   Antecedentes de haber tenido cogulos sanguneos (tromboflebitis)   Antecedentes de hemorragias o problemas sanguneos   Cirugas anteriores  RIESGOS Y COMPLICACIONES  Fracaso en el procedimiento para provocar infertilidad significa que la mujer puede quedar embarazada   Dolor en el postoperatorio Generalmente se trata con analgsicos.   Infeccin. Un germen comienza a desarrollarse en la herida. Generalmente se trata con antibiticos.   Una  reaccin alrgica a la anestesia o a otro Radiographer, therapeutic.   Hemorragias. La hemorragia puede aparecer debajo de la piel, de modo que el escroto y el pene parecen tener hematomas. En algunos casos el escroto puede hincharse y tener el tamao de un pomelo. Esto generalmente desaparece en [employer].  PROCEDIMIENTO  El escroto se limpia con jabn antibacterial y el profesional busca el conducto deferente.   Se anestesia cada lado del escroto.   Se realiza un corte muy pequeo(incisin) y el conducto deferente se saca afuera del escroto. El conducto deferente se ata, se corta o se quema (cauterizacin) en los extremos.   En algunos casos se retira del escroto a travs de una puncin. Esto se realiza con un instrumento especial, sin incisin.   Luego el conducto deferente se coloca nuevamente en el escroto y la incisin o puncin se cierran.   Luego de la [place], podr haber esperma en el conducto deferente por 1 a 3 meses. Por ello, debe utilizarse otro mtodo anticonceptivo hasta que el mdico lo examine y encuentre que no hay esperma en el lquido seminal.   La castracin es otro mtodo que consiste en la remocin de ambos testculos. Esto produce la esterilidad en el hombre.  La realizacin de una vasectoma debe discutirse con el mdico, estando su pareja presente. Formule preguntas y hable acerca de sus preocupaciones con el profesional que lo asiste. Luego, puede decidir si la operacin es segura para usted. Puede cambiar de opinin y cancelar la ciruga en cualquier momento.  INSTRUCCIONES PARA EL CUIDADO DOMICILIARIO  Utilice los medicamentos de venta libre o de prescripcin para Chief Technology Officer, el malestar o la [place],  segn se lo indique el profesional que lo asiste.   Una bolsa de hielo colocada durante 15 a 20 minutos, 3 a 4 veces por da disminuir la hinchazn.   Utilice un suspensor deportivo para disminuir la hinchazn.   Evite las [employer] primeros [address]  [address] posteriores a la [place].   Mantenga las incisiones limpias y cubiertas para evitar la infeccin.   [employer] risk analyst o segundo da despus de la [place], es normal cierta prdida de [place].   No practique deportes ni realice trabajos fsicos pesados durante al [employer].   Siga las instrucciones del mdico con respecto a la dieta, el reposo, el August, las [place] y sexuales y cumpla con las visitas de control.  SOLICITE ATENCIN MDICA DE INMEDIATO SI:  Presenta enrojecimiento, hinchazn o aumento del dolor en la herida o en los testculos.   Aparece pus en la herida.   La temperatura oral se eleva sin motivo por encima de 102 F (38.9 C) o segn le indique el profesional que lo asiste.   Advierte un olor ftido que proviene de la herida o del vendaje.  La herida se abre o los bordes se separan, an luego de la remocin de las suturas. En algunos casos las incisiones no se suturan. Esterilizacin en las mujeres (Sterilization, Women) La esterilizacin es un procedimiento quirrgico. Este tipo de ciruga evita el embarazo de [place]. Se realiza atando (cortando o no) las trompas de [employer] o a travs de la cauterizacin (ligadura de trompas). En la ligadura de trompas, estas se cierran para evitar que el vulo sea fertilizado por la esperma. Tambin la esterilizacin puede realizarse extirpando los ovarios que producen el huevo (castracin). Es un procedimiento seguro, con muy pocas complicaciones. No afecta el deseo ni el desempeo sexual.  Como es un procedimiento de [employer], no debe hacerlo hasta estar seguro de que no quiere tener hijos. Usted y su pareja deben estar en un todo de acuerdo antes del procedimiento. La decisin de [employer] vasectoma no debe tomarse durante una situacin estresante. Por ejemplo en caso de prdida de un embarazo, enfermedad, muerte de la esposa o divorcio. Hay otros mtodos de Advertising copywriter no deseado, hasta que est completamente segura de que desea ser esterilizada. Este procedimiento no la Health visitor las infecciones transmitidas sexualmente. Las mujeres que se han decidido por la esterilizacin y Museum/gallery exhibitions officer, deben saber que requiere de una operacin mayor y [employer]. La reversin puede fracasar y tiene un porcentaje elevado de embarazos ectpicos, que son peligrosos y requieren [place]. Hay varios formas de Futures trader de las trompas: Laparoscopa. Se llena el abdomen con gas para observar los rganos de la pelvis. Luego se inserta un tubo que tiene una luz dentro del abdomen, a travs de 2 pequeas incisiones. Las trompas de [employer] se cierran con un anillo, clip o se Management consultant. Luego se extrae el gas y se cierran las pequeas incisiones.  Histeroscopa Se inserta un tubo con una luz en la vagina, a travs del cuello del tero y luego al tero. Se inserta un instrumento similar a un resorte en la apertura de las trompas de [place]. El resorte causa cicatrices que bloquean las trompas. Otras formas de anticoncepcin deben usarse por tres meses, entonces le tomarn radiografas para verificar que las trompas estn bloqueadas.  [place] Se realiza inmediatamente despus de dar a luz. Se practica una pequea incisin debajo del ombligo y se exponen las trompas. Luego  las trompas se [place], se atan o cortan.  La ligadura de trompas puede hacerse luego de una intervencin cesrea.  La castracin es un procedimiento quirrgico para extirpar ambos ovarios.  La esterilizacin de las trompas debe comentarse con el mdico para que le responda todas las dudas a usted y a Risk analyst. Esta entrevista la ayudar a decidir si desea someterse a esta intervencin y qu procedimiento es el mejor para usted. Puede cambiar de opinin y cancelar la ciruga en cualquier momento. INSTRUCCIONES PARA EL CUIDADO DOMICILIARIO Siga las instrucciones del mdico  con respecto a la dieta, el reposo, el [place], las [place] y sexuales y cumpla con las visitas de control.  Luego de [place] laparoscopa es comn sentir dolor en los hombros. Este dolor se alivia acostndose en un lugar plano.  Utilice los medicamentos de venta libre o de prescripcin para Chief Technology Officer, Environmental health practitioner o la [place], segn se lo indique el profesional que lo asiste.  Podr ingerirunas pastillas para Engineer, materials de garganta.  Mantenga las incisiones limpias y cubiertas para evitar la infeccin.  SOLICITE ATENCIN MDICA DE INMEDIATO SI: La temperatura se eleva por encima de 102 F (38.9 C) o segn lo que le indique el mdico.  Se siente mareada o sufre un desmayo.  Si tiene Programme researcher, broadcasting/film/video (nuseas) y vmitos.  Siente dolor abdominal y no se alivia con los [employer].  Observa enrojecimiento e hinchazn en la zona de la incisin.  Observa pus que drena por la zona de la herida.  No tiene perodo menstrual.  Document Released: 10/08/2007 Document Revised: 04/10/2011  [facility] Patient Information 2012 [place], [place].  Observa una hemorragia que proviene de la herida.  EST SEGURO QUE:   Comprende las instrucciones para el alta mdica.   Controlar su enfermedad.   Solicitar atencin mdica de inmediato segn las indicaciones.  Document Released: 10/08/2007 Document Revised: 04/10/2011 [facility] Patient Information 2012 [place], [place].

## 2011-12-18 ENCOUNTER — Encounter: Payer: Self-pay | Admitting: *Deleted

## 2011-12-18 NOTE — Addendum Note (Signed)
Addended by: Neomia Dear on: 12/18/2011 08:11 AM   Modules accepted: Orders

## 2012-01-08 ENCOUNTER — Ambulatory Visit: Payer: Self-pay | Admitting: Obstetrics & Gynecology

## 2012-01-26 ENCOUNTER — Ambulatory Visit (INDEPENDENT_AMBULATORY_CARE_PROVIDER_SITE_OTHER): Payer: No Typology Code available for payment source | Admitting: Obstetrics & Gynecology

## 2012-01-26 ENCOUNTER — Encounter: Payer: Self-pay | Admitting: Obstetrics & Gynecology

## 2012-01-26 VITALS — BP 121/69 | HR 91 | Temp 97.2°F | Ht 59.0 in | Wt 114.4 lb

## 2012-01-26 DIAGNOSIS — Z3009 Encounter for other general counseling and advice on contraception: Secondary | ICD-10-CM

## 2012-01-26 NOTE — Patient Instructions (Signed)
Levonorgestrel intrauterine device (IUD) Qu es este medicamento? El LEVONORGESTREL (DIU) es un dispositivo anticonceptivo (control de natalidad). Se utiliza para evitar el embarazo durante un perodo de hasta 5 aos. Este medicamento puede ser utilizado para otros usos; si tiene alguna pregunta consulte con su proveedor de atencin mdica o con su farmacutico. Qu le debo informar a mi profesional de la salud antes de tomar este medicamento? Necesita saber si usted presenta alguno de los siguientes problemas o situaciones: -exmen de Papanicolaou anormal -cncer de mama, cuello del tero o tero -diabetes -endometritis -si tiene una infeccin plvica o genital actual o en el pasado -tiene ms de una pareja sexual o si su pareja tiene ms de una pareja -enfermedad cardiaca -antecedente de embarazo tubrico o ectpico -problemas del sistema inmunolgico -DIU colocado -enfermedad heptica o tumor del hgado -problemas con la coagulacin o si toma diluyentes sanguneos -usa medicamentos intravenoso -forma inusual del tero -sangrado vaginal que no tiene explicacin -una reaccin alrgica o inusual al levonorgestrel, a otras hormonas, a la silicona o polietilenos, a otros medicamentos, alimentos, colorantes o conservantes -si est embarazada o buscando quedar embarazada -si est amamantando a un beb Cmo debo utilizar este medicamento? Un profesional de la salud coloca este dispositivo en el tero. Hable con su pediatra para informarse acerca del uso de este medicamento en nios. Puede requerir atencin especial. Sobredosis: Pngase en contacto inmediatamente con un centro toxicolgico o una sala de urgencia si usted cree que haya tomado demasiado medicamento. ATENCIN: Este medicamento es solo para usted. No comparta este medicamento con nadie. Qu sucede si me olvido de una dosis? No se aplica en este caso. Qu puede interactuar con este medicamento? No tome esta medicina con  ninguno de los siguientes medicamentos: -amprenavir -bosentano -fosamprenavir Esta medicina tambin puede interactuar con los siguientes medicamentos: -aprepitant -barbitricos para producir el sueo o para el tratamiento de convulsiones -bexaroteno -griseofulvina -medicamentos para tratar los convulsiones, tales como carbamazepina, etotona, felbamato, oxcarbazepina, fenitona, topiramato -modafinilo -pioglitazona -rifabutina -rifampicina -rifapentina -algunos medicamentos para tratar el virus VIH, tales como atazanavir, indinavir, lopinavir, nelfinavir, tipranavir, ritonavir -hierba de [place] -warfarina Puede ser que esta lista no menciona todas las posibles interacciones. Informe a su profesional de la salud de todos los productos a base de hierbas, medicamentos de venta libre o suplementos nutritivos que est tomando. Si usted fuma, consume bebidas alcohlicas o si utiliza drogas ilegales, indqueselo tambin a su profesional de la salud. Algunas sustancias pueden interactuar con su medicamento. A qu debo estar atento al usar este medicamento? Visite a su mdico o a su profesional de la salud para chequear su evolucin peridicamente. Visite a su mdico si usted o su pareja tiene relaciones sexuales con otras personas, se vuelve VIH positivo o contrae una enfermedad de transmisin sexual. Este medicamento no la protege de la infeccin por VIH (SIDA) ni de ninguna otra enfermedad de transmisin sexual. Puede controlar la ubicacin del DIU usted misma palpando con sus dedos limpios los hilos en la parte anterior de la vagina. No tire de los hilos. Es un buen hbito controlar la ubicacin del dispositivo despus de cada perodo menstrual. Si no slo siente los hilos sino que adems siente otra parte ms del DIU o si no puede sentir los hilos, consulte a su mdico inmediatamente. El DIU puede salirse por s solo. Puede quedar embarazada si el dispositivo se sale de su lugar. Utilice un  mtodo anticonceptivo adicional, como preservativos, y consulte a su proveedor de atencin mdica s observa que   el DIU se sali de su lugar. La utilizacin de tampones no cambia la posicin del DIU y no hay inconvenientes en usarlos durante su perodo. Qu efectos secundarios puedo tener al utilizar este medicamento? Efectos secundarios que debe informar a su mdico o a su profesional de la salud tan pronto como sea posible: -reacciones alrgicas como erupcin cutnea, picazn o urticarias, hinchazn de la cara, labios o lengua -fiebre, sntomas gripales -llagas genitales -alta presin sangunea -ausencia de un perodo menstrual durante 6 semanas mientras lo utiliza -dolor, hinchazn o calor en las piernas -dolor o sensibilidad del plvico -dolor de cabeza repentino o severo -signos de embarazo -calambres estomacales -falta de aliento repentina -problemas de coordinacin, del habla, al caminar -sangrado, flujo vaginal inusual -color amarillento de los ojos o la piel Efectos secundarios que, por lo general, no requieren atencin mdica (debe informarlos a su mdico o a su profesional de la salud si persisten o si son molestos): -acn -dolor de pecho -cambios en el deseo sexual o capacidad -cambios de peso -calambres, mareos o sensacin de desmayo mientras se introduce el dispositivo -dolor de cabeza -sangrado menstruales irregulares en los primeros 3 a 6 meses de usar -nuseas Puede ser que esta lista no menciona todos los posibles efectos secundarios. Comunquese a su mdico por asesoramiento mdico sobre los efectos secundarios. Usted puede informar los efectos secundarios a la FDA por telfono al 1-800-FDA-1088. Dnde debo guardar mi medicina? No se aplica en este caso. ATENCIN: Este folleto es un resumen. Puede ser que no cubra toda la posible informacin. Si usted tiene preguntas acerca de esta medicina, consulte con su mdico, su farmacutico o su profesional de la salud.   2012, [employer]vier[employer]. (07/03/2008 3:17:12 PM) 

## 2012-01-26 NOTE — Progress Notes (Signed)
Subjective:     Patient ID: Lisa Villarreal, female   DOB: January 04, 1976, 36 y.o.   MRN: [id number]  HPI Pt presents to discuss contraception.  Husband refused vasectomy.  Pt requests permanent sterilization.     Review of Systems     Objective:   Physical Exam deferred    Assessment:     Contraception counseling    Plan:     Pt will complete financial aid paperwork wants ESSURE or permanent sterilization but, will get the Mirena if she can get a grant for that  Pt offered a visit with the financial counselor  F/u prn

## 2012-02-19 ENCOUNTER — Ambulatory Visit: Payer: Self-pay | Admitting: Advanced Practice Midwife

## 2012-06-07 ENCOUNTER — Ambulatory Visit (INDEPENDENT_AMBULATORY_CARE_PROVIDER_SITE_OTHER): Payer: No Typology Code available for payment source | Admitting: *Deleted

## 2012-06-07 DIAGNOSIS — Z23 Encounter for immunization: Secondary | ICD-10-CM

## 2012-08-04 ENCOUNTER — Ambulatory Visit: Payer: No Typology Code available for payment source | Admitting: Obstetrics & Gynecology

## 2012-08-27 ENCOUNTER — Ambulatory Visit: Payer: No Typology Code available for payment source

## 2012-08-30 ENCOUNTER — Encounter: Payer: Self-pay | Admitting: Obstetrics & Gynecology

## 2012-08-30 ENCOUNTER — Ambulatory Visit (INDEPENDENT_AMBULATORY_CARE_PROVIDER_SITE_OTHER): Payer: No Typology Code available for payment source | Admitting: Obstetrics & Gynecology

## 2012-08-30 VITALS — BP 122/70 | HR 88 | Temp 97.2°F | Ht 62.0 in | Wt 115.9 lb

## 2012-08-30 DIAGNOSIS — Z01419 Encounter for gynecological examination (general) (routine) without abnormal findings: Secondary | ICD-10-CM

## 2012-08-30 NOTE — Patient Instructions (Signed)
Levonorgestrel intrauterine device (IUD) Qu es este medicamento? El LEVONORGESTREL (DIU) es un dispositivo anticonceptivo (control de natalidad). Se utiliza para evitar el embarazo durante un perodo de hasta 5 aos. Este medicamento puede ser utilizado para otros usos; si tiene alguna pregunta consulte con su proveedor de atencin mdica o con su farmacutico. Qu le debo informar a mi profesional de la salud antes de tomar este medicamento? Necesita saber si usted presenta alguno de los siguientes problemas o situaciones: -exmen de Papanicolaou anormal -cncer de mama, cuello del tero o tero -diabetes -endometritis -si tiene una infeccin plvica o genital actual o en el pasado -tiene ms de una pareja sexual o si su pareja tiene ms de una pareja -enfermedad cardiaca -antecedente de embarazo tubrico o ectpico -problemas del sistema inmunolgico -DIU colocado -enfermedad heptica o tumor del hgado -problemas con la coagulacin o si toma diluyentes sanguneos -usa medicamentos intravenoso -forma inusual del tero -sangrado vaginal que no tiene explicacin -una reaccin alrgica o inusual al levonorgestrel, a otras hormonas, a la silicona o polietilenos, a otros medicamentos, alimentos, colorantes o conservantes -si est embarazada o buscando quedar embarazada -si est amamantando a un beb Cmo debo utilizar este medicamento? Un profesional de la salud coloca este dispositivo en el tero. Hable con su pediatra para informarse acerca del uso de este medicamento en nios. Puede requerir atencin especial. Sobredosis: Pngase en contacto inmediatamente con un centro toxicolgico o una sala de urgencia si usted cree que haya tomado demasiado medicamento. ATENCIN: Este medicamento es solo para usted. No comparta este medicamento con nadie. Qu sucede si me olvido de una dosis? No se aplica en este caso. Qu puede interactuar con este medicamento? No tome esta medicina con  ninguno de los siguientes medicamentos: -amprenavir -bosentano -fosamprenavir Esta medicina tambin puede interactuar con los siguientes medicamentos: -aprepitant -barbitricos para producir el sueo o para el tratamiento de convulsiones -bexaroteno -griseofulvina -medicamentos para tratar los convulsiones, tales como carbamazepina, etotona, felbamato, oxcarbazepina, fenitona, topiramato -modafinilo -pioglitazona -rifabutina -rifampicina -rifapentina -algunos medicamentos para tratar el virus VIH, tales como atazanavir, indinavir, lopinavir, nelfinavir, tipranavir, ritonavir -hierba de [place] -warfarina Puede ser que esta lista no menciona todas las posibles interacciones. Informe a su profesional de la salud de todos los productos a base de hierbas, medicamentos de venta libre o suplementos nutritivos que est tomando. Si usted fuma, consume bebidas alcohlicas o si utiliza drogas ilegales, indqueselo tambin a su profesional de la salud. Algunas sustancias pueden interactuar con su medicamento. A qu debo estar atento al usar este medicamento? Visite a su mdico o a su profesional de la salud para chequear su evolucin peridicamente. Visite a su mdico si usted o su pareja tiene relaciones sexuales con otras personas, se vuelve VIH positivo o contrae una enfermedad de transmisin sexual. Este medicamento no la protege de la infeccin por VIH (SIDA) ni de ninguna otra enfermedad de transmisin sexual. Puede controlar la ubicacin del DIU usted misma palpando con sus dedos limpios los hilos en la parte anterior de la vagina. No tire de los hilos. Es un buen hbito controlar la ubicacin del dispositivo despus de cada perodo menstrual. Si no slo siente los hilos sino que adems siente otra parte ms del DIU o si no puede sentir los hilos, consulte a su mdico inmediatamente. El DIU puede salirse por s solo. Puede quedar embarazada si el dispositivo se sale de su lugar. Utilice un  mtodo anticonceptivo adicional, como preservativos, y consulte a su proveedor de atencin mdica s observa que   el DIU se sali de su lugar. La utilizacin de tampones no cambia la posicin del DIU y no hay inconvenientes en usarlos durante su perodo. Qu efectos secundarios puedo tener al utilizar este medicamento? Efectos secundarios que debe informar a su mdico o a su profesional de la salud tan pronto como sea posible: -reacciones alrgicas como erupcin cutnea, picazn o urticarias, hinchazn de la cara, labios o lengua -fiebre, sntomas gripales -llagas genitales -alta presin sangunea -ausencia de un perodo menstrual durante 6 semanas mientras lo utiliza -dolor, hinchazn o calor en las piernas -dolor o sensibilidad del plvico -dolor de cabeza repentino o severo -signos de embarazo -calambres estomacales -falta de aliento repentina -problemas de coordinacin, del habla, al caminar -sangrado, flujo vaginal inusual -color amarillento de los ojos o la piel Efectos secundarios que, por lo general, no requieren atencin mdica (debe informarlos a su mdico o a su profesional de la salud si persisten o si son molestos): -acn -dolor de pecho -cambios en el deseo sexual o capacidad -cambios de peso -calambres, mareos o sensacin de desmayo mientras se introduce el dispositivo -dolor de cabeza -sangrado menstruales irregulares en los primeros 3 a 6 meses de usar -nuseas Puede ser que esta lista no menciona todos los posibles efectos secundarios. Comunquese a su mdico por asesoramiento mdico sobre los efectos secundarios. Usted puede informar los efectos secundarios a la FDA por telfono al 1-800-FDA-1088. Dnde debo guardar mi medicina? No se aplica en este caso. ATENCIN: Este folleto es un resumen. Puede ser que no cubra toda la posible informacin. Si usted tiene preguntas acerca de esta medicina, consulte con su mdico, su farmacutico o su profesional de la salud.   2012, Elsevier[employer]. (07/03/2008 3:17:12 PM)Eleccin del mtodo anticonceptivo  (Contraception Choices) La anticoncepcin (control de la natalidad) es el uso de cualquier mtodo o dispositivo para evitar el embarazo. A continuacin se indican algunos de esos mtodos.  MTODOS HORMONALES   Implante anticonceptivo. Es un tubo plstico delgado que contiene la hormona progesterona. No contiene estrgenos. El mdico inserta el tubo en la parte interna del brazo. El tubo puede permanecer en el lugar durante 3 aos. Despus de los 3 aos debe retirarse. El implante impide que los ovarios liberen vulos (ovulacin), espesa el moco cervical, lo que evita que los espermatozoides ingresen al tero y hace ms delgada la membrana que cubre el interior del tero.  Inyecciones de progesterona sola. Estas inyecciones se administran cada 3 meses para evitar el embarazo. La progesterona sinttica impide que los ovarios liberen vulos. Tambin hace que el moco cervical se espese y modifica el recubrimiento interno del tero. Esto hace ms difcil que los espermatozoides sobrevivan en el tero.  Pldoras anticonceptivas. Las pldoras anticonceptivas contienen estrgenos y progesterona. Actan impidiendo que el vulo se forme en el ovario(ovulacin). Las pldoras anticonceptivas son recetadas por el mdico.Tambin se utilizan para tratar los perodos menstruales abundantes.  Minipldora. Este tipo de pldora anticonceptiva contiene slo hormona progesterona. Deben tomarse todos los das del mes y debe recetarlas el mdico.  Parches anticonceptivos. El parche contiene hormonas similares a las que contienen las pldoras anticonceptivas. Deben cambiarse una vez por semana y se utilizan bajo prescripcin mdica.  Anillo vaginal. Anillo vaginal contiene hormonas similares a las que contienen las pldoras anticonceptivas. Se deja colocado durante tres semanas, se lo retira durante 1 semana y luego se coloca uno nuevo.  La paciente debe sentirse cmoda para insertar y retirar el anillo de la vagina.Es necesaria la receta del mdico.  Anticonceptivos   de emergencia. Los anticonceptivos de emergencia son mtodos para evitar un embarazo despus de una relacin sexual sin proteccin. Esta pldora puede tomarse inmediatamente despus de tener relaciones sexuales o hasta 5 das de haber tenido sexo sin proteccin. Es ms efectiva si se toma poco tiempo despus. Los anticonceptivos de emergencia estn disponibles sin prescripcin mdica. Consltelo con su farmacutico. No use los anticonceptivos de emergencia como nico mtodo anticonceptivo. MTODOS DE BARRERA   Condn masculino. Es una vaina delgada (ltex o goma) que se usa en el pene durante el acto sexual. Puede usarse con espermicida para aumentar la efectividad.  Condn femenino. Es una vaina blanda y floja que se adapta suavemente a la vagina antes de las relaciones sexuales.  Diafragma. Es una barrera de ltex redonda y suave que debe ser ajustada por un profesional. Se inserta en la vagina, junto con un gel espermicida. Debe insertarse antes de tener relaciones sexuales. Debe dejar el diafragma colocado en la vagina durante 6 a 8 horas despus de la relacin sexual.  Capuchn cervical. Es una taza de ltex o plstico, redonda y suave que cubre el cuello del tero y debe ser ajustada por un mdico. Puede dejarlo colocado en la vagina hasta 48 horas despus de las relaciones sexuales.  Esponja. Es una pieza blanda y circular de espuma de poliuretano. Contiene un espermicida. Se inserta en la vagina despus de mojarla y antes de las relaciones sexuales.  Espermicidas. Los espermicidas son qumicos que matan o bloquean el esperma y no lo dejan ingresar al cuello del tero y al tero. Vienen en forma de cremas, geles, supositorios, espuma o comprimidos. No es necesario tener receta mdica. Se insertan en la vagina con un aplicador antes de tener relaciones sexuales. El  proceso debe repetirse cada vez que tiene relaciones sexuales. ANTICONCEPTIVOS INTRAUTERINOS   Dispositivo intrauterino (DIU). Es un dispositivo en forma de T que se coloca en el tero durante el perodo menstrual, para evitar el embarazo. Hay dos tipos:  DIU de cobre. Este tipo de DIU est recubierto con un alambre de cobre y se inserta dentro del tero. El cobre hace que el tero y las trompas de Falopio produzcan un liquido que destruye los espermatozoides. Puede permanecer colocado durante 10 aos.  DIU hormonal. Este tipo de DIU contiene la hormona progestina (progesterona sinttica). La hormona espesa el moco cervical y evita que los espermatozoides ingresen al tero y tambin afina la membrana que cubre el tero para evitar la implantacin del vulo fertilizado. La hormona debilita o destruye los espermatozoides que ingresan al tero. Puede permanecer colocado durante 5 aos. MTODOS ANTICONCEPTIVOS PERMANENTES   Ligadura de trompas en la mujer. La ligadura de trompas en la mujer se realiza sellando, atando u obstruyendo quirrgicamente las trompas de Falopio lo que impide que el vulo descienda hacia el tero.  Esterilizacin masculina. Se realiza atando los conductos por los que pasan los espermatozoides (vasectoma).Esto impide que el esperma ingrese a la vagina durante el acto sexual. Luego del procedimiento, el hombre puede eyacular lquido (semen). MTODOS DE PLANIFICACIN NATURAL   Planificacin familiar natural.  Consiste en no tener relaciones sexuales o usar un mtodo de barrera (condn, diafragma, capuchn cervical) en los das que la mujer podra quedar embarazada.  Mtodo calendario.  Consiste en el seguimiento de la duracin de cada ciclo menstrual y la identificacin de los perodos frtiles.  Mtodo de la ovulacin.  Consiste en evitar las relaciones sexuales durante la ovulacin.  Mtodo sintotrmico. Consiste en evitar las relaciones   sexuales en la poca en la que se  est ovulando, utilizando un termmetro y tendiendo en cuenta los sntomas de la ovulacin.  Mtodo post-ovulacin. Consiste en planificar las relaciones sexuales para despus de haber ovulado. Independientemente del tipo o mtodo anticonceptivo que usted elija, es importante que use condones para protegerse contra las enfermedades de transmisin sexual (ETS). Hable con su mdico con respecto a qu mtodo anticonceptivo es el ms apropiado para usted.  Document Released: 04/21/2005 Document Revised: 07/14/2011 [employer] Patient Information 2013 [employer].  

## 2012-08-30 NOTE — Progress Notes (Signed)
Patient ID: Lisa Villarreal, female   DOB: 18-Oct-1975, 37 y.o.   MRN: [id number] Lisa Villarreal is an 37 y.o. female.Pt presents for routine GYN exam.  She denies problems.  She did not return for an ESSURE as she and her spouse are saving money for the procedure.  She reports that she uses condoms and worries often about getting pregant. Says she is worried about the other options because she would still use a condom.  Pertinent Gynecological History: Menses: flow is moderate Bleeding: normal Contraception: condoms DES exposure: denies Blood transfusions: none Previous GYN Procedures: c-section   Menstrual History:  Patient's last menstrual period was 08/08/2012.    Past Medical History  Diagnosis Date  . Headache, migraine     Past Surgical History  Procedure Laterality Date  . Cesarean section      Family History  Problem Relation Age of Onset  . Leukemia Maternal Aunt     Social History:  reports that she has never smoked. She has never used smokeless tobacco. She reports that she does not drink alcohol or use illicit drugs.  Allergies:  Allergies  Allergen Reactions  . Penicillins     REACTION: Swelling     (Not in a hospital admission)  ROS  Blood pressure 122/70, pulse 88, temperature 97.2 F (36.2 C), temperature source Oral, height 5\' 2"  (1.575 m), weight 115 lb 14.4 oz (52.572 kg), last menstrual period 08/08/2012. Physical Exam Lungs: CTA CV: RRR Abd; well healed vertical incision.  NT, ND, no rebound no guarding GU: EGBUS: no lesions Vagina: no blood in vault Cervix: no lesion; no mucopurulent d/c Uterus: small, mobile Adnexa: no masses; non tender     No results found for this or any previous visit (from the past 24 hour(s)).  No results found.  Assessment/Plan: Annual GYN exam doing well F/u PAP, HPV and cx Reviewed with pt contraception.  She will read the info and review with her spouse.   HARRAWAY-SMITH,  Mikyle Sox 08/30/2012, 4:13 PM

## 2012-11-15 ENCOUNTER — Ambulatory Visit (INDEPENDENT_AMBULATORY_CARE_PROVIDER_SITE_OTHER): Payer: No Typology Code available for payment source | Admitting: Internal Medicine

## 2012-11-15 ENCOUNTER — Encounter: Payer: Self-pay | Admitting: Internal Medicine

## 2012-11-15 VITALS — BP 103/62 | HR 69 | Temp 97.2°F | Ht 60.0 in | Wt 114.6 lb

## 2012-11-15 DIAGNOSIS — D2360 Other benign neoplasm of skin of unspecified upper limb, including shoulder: Secondary | ICD-10-CM

## 2012-11-15 DIAGNOSIS — F411 Generalized anxiety disorder: Secondary | ICD-10-CM

## 2012-11-15 DIAGNOSIS — D2262 Melanocytic nevi of left upper limb, including shoulder: Secondary | ICD-10-CM | POA: Insufficient documentation

## 2012-11-15 DIAGNOSIS — Z0000 Encounter for general adult medical examination without abnormal findings: Secondary | ICD-10-CM

## 2012-11-15 LAB — CBC
HCT: 34.2 % — ABNORMAL LOW (ref 36.0–46.0)
MCHC: 33.3 g/dL (ref 30.0–36.0)
MCV: 78.8 fL (ref 78.0–100.0)
RDW: 15.1 % (ref 11.5–15.5)

## 2012-11-15 NOTE — Progress Notes (Signed)
  Subjective:    Patient ID: Lisa Villarreal, female    DOB: [birth year], 37 y.o.   MRN: [id number]  HPI  37 yo Hispanic female with hx significant for generalized anxiety presents for annual check up.  Would like referral to Dermatology for evaluation of left skin mole that was evaluated as abnormal at a Skin Cancer Screening.  Had normal pap smear per [facility]. Without complaints but would like cholesterol checked and to "check for diabetes".    Review of Systems  Constitutional: Negative for fatigue and unexpected weight change.  HENT: Negative for congestion.   Respiratory: Negative for shortness of breath.   Cardiovascular: Negative for chest pain, palpitations and leg swelling.  Gastrointestinal: Negative for nausea, vomiting, diarrhea and constipation.  Genitourinary: Negative for dysuria and menstrual problem.  Neurological: Negative for weakness.       Objective:   Physical Exam  Constitutional: She is oriented to person, place, and time. She appears well-developed and well-nourished. No distress.  HENT:  Head: Normocephalic and atraumatic.  Eyes: Conjunctivae are normal. Pupils are equal, round, and reactive to light.  Neck: Normal range of motion. Neck supple.  Cardiovascular: Normal rate, regular rhythm and normal heart sounds.   Pulmonary/Chest: Effort normal and breath sounds normal.  Abdominal: Soft. Bowel sounds are normal.  Musculoskeletal: Normal range of motion. She exhibits no edema and no tenderness.  Neurological: She is alert and oriented to person, place, and time.  Skin: Skin is warm and dry. Lesion noted.     Hyperpigmented, small raised mole on left hand  Psychiatric: She has a normal mood and affect. Her behavior is normal. Judgment and thought content normal.          Assessment & Plan:  Please see problem list for detailed assessment in plan but in brief: 1. Health Maintenance: normal Pap results, will check lipid  panel,bmet and cbc today.  2. Anxiety: stable, w/o complaints  3. Nevus of left hand: pt states that she has had this mole since she was young and that it has not grown in size or changed in shape. -refer to Dermatology--> informed pt that she would be placed on a waiting list for providers whom accept the [facility]

## 2012-11-16 LAB — BASIC METABOLIC PANEL
CO2: 24 mEq/L (ref 19–32)
Calcium: 9.2 mg/dL (ref 8.4–10.5)
Glucose, Bld: 82 mg/dL (ref 70–99)
Sodium: 137 mEq/L (ref 135–145)

## 2012-11-16 LAB — LIPID PANEL
Cholesterol: 150 mg/dL (ref 0–200)
LDL Cholesterol: 64 mg/dL (ref 0–99)
VLDL: 33 mg/dL (ref 0–40)

## 2012-11-16 NOTE — Assessment & Plan Note (Signed)
Pap normal, no family hx of breast cancer thus will await mammogram  Age = >40, declines Tetanus shot -check cbc, bmet, lipid panel

## 2012-11-16 NOTE — Assessment & Plan Note (Signed)
Refer to dermatology 

## 2012-11-16 NOTE — Assessment & Plan Note (Signed)
Stable on no medications -cont to monitor

## 2012-11-29 NOTE — Progress Notes (Signed)
Case discussed with Dr. Schooler (at time of visit, soon after the resident saw the patient).  We reviewed the resident's history and exam and pertinent patient test results.  I agree with the assessment, diagnosis, and plan of care documented in the resident's note. 

## 2013-01-15 ENCOUNTER — Inpatient Hospital Stay (HOSPITAL_COMMUNITY)
Admission: AD | Admit: 2013-01-15 | Discharge: 2013-01-15 | Disposition: A | Discharge status: Home / Self Care | Payer: No Typology Code available for payment source | Source: Ambulatory Visit | Attending: Obstetrics & Gynecology | Admitting: Obstetrics & Gynecology

## 2013-01-15 ENCOUNTER — Encounter (HOSPITAL_COMMUNITY): Payer: Self-pay | Admitting: Family

## 2013-01-15 DIAGNOSIS — N6320 Unspecified lump in the left breast, unspecified quadrant: Secondary | ICD-10-CM

## 2013-01-15 DIAGNOSIS — N63 Unspecified lump in unspecified breast: Secondary | ICD-10-CM | POA: Insufficient documentation

## 2013-01-15 LAB — POCT PREGNANCY, URINE: Preg Test, Ur: NEGATIVE

## 2013-01-15 NOTE — MAU Note (Signed)
Pt presents with complaints of "found a ball in my left breast yesterday"

## 2013-01-15 NOTE — MAU Provider Note (Signed)
History     CSN: [id number]  Arrival date and time: 01/15/13 8295   First Provider Initiated Contact with Patient 01/15/13 [phone]      Chief Complaint  Patient presents with  . Breast Mass   HPI  [name] is a 37 y.o.non-pregnant female [phone] who presents with concerns of a breast mass she found yesterday on her left breast. The mass is not painful; and "feels very large". The patient has never had a mammogram. Two months ago she noticed a similar, yet smaller lump in the same area. She was at the [facility] clinic in May and had a full physical, she was told her breasts were normal.   OB History   Grav Para Term Preterm Abortions TAB SAB Ect Mult Living   4 3 3  0 1 0 1 0 0 3      Past Medical History  Diagnosis Date  . Headache, migraine     Past Surgical History  Procedure Laterality Date  . Cesarean section      Family History  Problem Relation Age of Onset  . Leukemia Maternal Aunt     History  Substance Use Topics  . Smoking status: Never Smoker   . Smokeless tobacco: Never Used  . Alcohol Use: No    Allergies:  Allergies  Allergen Reactions  . Penicillins     REACTION: Swelling    Prescriptions prior to admission  Medication Sig Dispense Refill  . Ibuprofen (ADVIL) 200 MG CAPS Take 2 capsules by mouth daily as needed (headache).      . Multiple Vitamin (MULTIVITAMIN) tablet Take 1 tablet by mouth daily.      . Omega-3 Fatty Acids (FISH OIL PO) Take 1 tablet by mouth daily.       Results for orders placed during the hospital encounter of 01/15/13 (from the past 24 hour(s))  POCT PREGNANCY, URINE     Status: None   Collection Time    01/15/13  8:21 AM      Result Value Range   Preg Test, Ur NEGATIVE  NEGATIVE    Review of Systems  Constitutional: Negative for fever, chills and weight loss.  Cardiovascular: Negative for chest pain.  Neurological: Negative for weakness.  +left breast lump found on self breast exam.   Physical  Exam   Blood pressure 127/66, pulse 88, temperature 97.6 F (36.4 C), temperature source Oral, resp. rate 18, height 4\' 11"  (1.499 m), weight 49.714 kg (109 lb 9.6 oz), last menstrual period 01/01/2013.  Physical Exam  Constitutional: She appears well-developed and well-nourished. No distress.  HENT:  Head: Normocephalic.  Neck: Neck supple.  Respiratory: She exhibits mass. She exhibits no tenderness, no edema, no deformity and no retraction. Right breast exhibits no inverted nipple, no mass, no nipple discharge, no skin change and no tenderness. Left breast exhibits mass. Left breast exhibits no inverted nipple, no nipple discharge, no skin change and no tenderness.    Left breast mass; 9 o'clock; dime size, hard, mobile, no tenderness, no axillary tenderness   GI: Soft.  Skin: She is not diaphoretic.    MAU Course  Procedures None  MDM Breast exam   Assessment and Plan  A: Left breast mass    P:  Discharge home Call the breast center on Monday morning to schedule your diagnostic digital mammography; I faxed a referral form  Message sent to the women's clinic for follow up Return to MAU for worsening symptoms Support given.   RASCH,  JENNIFER IRENE  FNP-C  01/15/2013, 3:02 PM

## 2013-01-15 NOTE — MAU Provider Note (Signed)
Attestation of Attending Supervision of Advanced Practitioner (PA/CNM/NP): Evaluation and management procedures were performed by the Advanced Practitioner under my supervision and collaboration.  I have reviewed the Advanced Practitioner's note and chart, and I agree with the management and plan.  Ordean Fouts, MD, FACOG Attending Obstetrician & Gynecologist Faculty Practice, [facility]  

## 2013-01-15 NOTE — MAU Note (Signed)
Patient presents to MAU with c/o noticing L breast lump changes during self-exam yesterday. Reports she was seen at [facility] clinic two months ago and was told it was fine. Reports lump feels larger and harder than noticed prior.  LMP 01/01/13. Denies + family hx for breast cancer. Maternal aunt had leukemia. Patient also has cousin recently diagnosed with brain tumor. Denies dimpling, changes in skin color, temperature, nipple drainage.

## 2013-01-17 ENCOUNTER — Ambulatory Visit
Admission: RE | Admit: 2013-01-17 | Discharge: 2013-01-17 | Disposition: A | Discharge status: Home / Self Care | Payer: No Typology Code available for payment source | Source: Ambulatory Visit | Attending: Obstetrics and Gynecology | Admitting: Obstetrics and Gynecology

## 2013-01-17 ENCOUNTER — Other Ambulatory Visit: Payer: Self-pay | Admitting: Obstetrics and Gynecology

## 2013-01-17 DIAGNOSIS — N6320 Unspecified lump in the left breast, unspecified quadrant: Secondary | ICD-10-CM

## 2013-01-25 ENCOUNTER — Ambulatory Visit (HOSPITAL_COMMUNITY): Payer: No Typology Code available for payment source

## 2013-01-26 ENCOUNTER — Encounter: Payer: No Typology Code available for payment source | Admitting: Obstetrics & Gynecology

## 2013-02-17 ENCOUNTER — Ambulatory Visit: Payer: No Typology Code available for payment source

## 2013-02-18 ENCOUNTER — Ambulatory Visit (INDEPENDENT_AMBULATORY_CARE_PROVIDER_SITE_OTHER): Payer: No Typology Code available for payment source | Admitting: *Deleted

## 2013-02-18 DIAGNOSIS — Z23 Encounter for immunization: Secondary | ICD-10-CM

## 2013-06-01 ENCOUNTER — Ambulatory Visit: Payer: No Typology Code available for payment source

## 2013-06-09 ENCOUNTER — Ambulatory Visit: Payer: Self-pay

## 2013-06-16 ENCOUNTER — Ambulatory Visit: Payer: Self-pay

## 2013-08-19 ENCOUNTER — Other Ambulatory Visit: Payer: Self-pay | Admitting: Family Medicine

## 2013-09-26 ENCOUNTER — Ambulatory Visit (INDEPENDENT_AMBULATORY_CARE_PROVIDER_SITE_OTHER): Payer: BC Managed Care – PPO | Admitting: Family Medicine

## 2013-09-26 VITALS — BP 110/62 | HR 68 | Temp 97.6°F | Resp 18 | Ht 59.0 in | Wt 115.0 lb

## 2013-09-26 DIAGNOSIS — L237 Allergic contact dermatitis due to plants, except food: Secondary | ICD-10-CM

## 2013-09-26 DIAGNOSIS — L255 Unspecified contact dermatitis due to plants, except food: Secondary | ICD-10-CM

## 2013-09-26 MED ORDER — TRIAMCINOLONE ACETONIDE 0.1 % EX CREA: 1.0000 "application " | TOPICAL_CREAM | Freq: Four times a day (QID) | CUTANEOUS | Status: DC | PRN

## 2013-09-26 MED ORDER — METHYLPREDNISOLONE ACETATE 80 MG/ML IJ SUSP
120.0000 mg | Freq: Once | INTRAMUSCULAR | Status: AC
  Administered 2013-09-26: 120 mg via INTRAMUSCULAR

## 2013-09-26 MED ORDER — HYDROXYZINE HCL 25 MG PO TABS: 12.5000 mg | ORAL_TABLET | Freq: Three times a day (TID) | ORAL | Status: DC | PRN

## 2013-09-26 NOTE — Patient Instructions (Signed)
Hiedra venenosa  (Poison Ivy) Luego de la exposicin previa a la planta. La erupcin suele aparecer 48 horas despus de la exposicin. Suelen ser bultos (ppulas) o ampollas (vesculas) en un patrn lineal. abrirse. Los ojos tambin podran hincharse. Las hinchazn es peor por la maana y mejora a medida que Magazine features editor. Deben tomarse todas las precauciones para prevenir una infeccin bacteriana (por grmenes) secundaria, que puede ocasionar cicatrices. Mantenga todas las reas abiertas secas, limpias y vendadas y cbralas con un ungento antibacteriano, en caso que lo necesite. Si no aparece una infeccin secundaria, esta dermatitis generalmente se [place] 2 o 3 semanas sin tratamiento. INSTRUCCIONES PARA EL CUIDADO DOMICILIARIO Lvese cuidadosamente con agua y jabn tan pronto como ocurra la exposicin al txico. Tiene alrededor de media hora para retirar la resina de la planta antes de que le cause el sarpullido. El lavado destruir rpidamente el aceite o antgeno que se encuentra sobre la piel y que podr causar el sarpullido. Lave enrgicamente debajo de las uas. Todo resto de resina seguir diseminando el sarpullido. No se frote la piel vigorosamente cuando lava la zona afectada. La dermatitis no se extender si retira todo el aceite de la planta que haya quedado en su cuerpo. Un sarpullido que se ha transformado en lesiones que supuran (llagas) no diseminar el sarpullido, a menos que no se haya lavado cuidadosamente. Tambin es importante lavar todas las prendas que [place]. Pueden tener alrgenos [employer]. El sarpullido volver, an varios das ms tarde. La mejor medida es evitar el contacto con la planta en el futuro. La hiedra venenosa puede reconocerse por el nmero de hojas, En general, la hiedra venenosa tiene tres hojas con ramas floridas en un tallo simple. Podr adquirir difenhidramina que es un medicamento de venta [place], y Pharmacologist segn lo necesite para Curator. No conduzca automviles si este medicamento le produce somnolencia. Consulte con el profesional que lo asiste acerca de los medicamentos que podr administrarle a los nios. SOLICITE ATENCIN MDICA SI:  Observa reas abiertas.  Enrojecimiento que se extiende ms all de la zona del sarpullido.  Una secrecin purulenta (similar al pus).  Aumento del dolor.  Desarrolla otros signos de infeccin (como fiebre). Document Released: 01/29/2005 Document Revised: 07/14/2011 [facility] Patient Information 2014 [place], [place].

## 2013-09-26 NOTE — Progress Notes (Signed)
Subjective:  This chart was scribed for Lisa Villarreal. Brigitte Pulse, MD  by Stacy Gardner, [facility] Scribe. The patient was seen in room and the patient's care was started at 12:31 PM.  Patient ID: Lisa Villarreal, female    DOB: Oct 11, 1975, 38 y.o.   MRN: [id number] Chief Complaint  Patient presents with  . Poison Ivy    x1 week arms and neck after working ing the yard     [employer] Pertinent negatives include no congestion or cough.   HPI Comments: Lisa Villarreal is a 38 y.o. female who arrives to the [facility] complaining of rash, onset one week ago after being exposed to poison ivy while doing yard work. The rash started getting much worse two days ago and her right arm has swelling.  She has tried Tecnu and OTC topical cortisone cream without any improvement of her symptoms. Denies appetite changes. Denies cough, congestion, and wheezing.   Pt has allergies to penicillin.   Past Medical History  Diagnosis Date  . Headache, migraine    Current Outpatient Prescriptions on File Prior to Visit  Medication Sig Dispense Refill  . Ibuprofen (ADVIL) 200 MG CAPS Take 2 capsules by mouth daily as needed (headache).       No current facility-administered medications on file prior to visit.   Allergies  Allergen Reactions  . Penicillins     REACTION: Swelling    Review of Systems  Constitutional: Negative for appetite change.  HENT: Negative for congestion.   Respiratory: Negative for cough and wheezing.   Skin: Positive for color change and pallor.       Objective:   Physical Exam  Nursing note and vitals reviewed. Constitutional: She is oriented to person, place, and time. She appears well-developed and well-nourished. No distress.  HENT:  Head: Normocephalic.  Eyes: Pupils are equal, round, and reactive to light.  Neck: Normal range of motion.  Cardiovascular: Normal rate.   Pulmonary/Chest: Effort normal and breath sounds  normal.  Abdominal: Bowel sounds are normal.  Neurological: She is alert and oriented to person, place, and time. No cranial nerve deficit. She exhibits normal muscle tone. Coordination normal.  Skin: Skin is warm and dry. Rash noted. Rash is papular and vesicular. She is not diaphoretic. There is erythema.  Rash throughtout forearm and upper arm . Blanching erythematous plaques.   Spread almost contiguous throughout forearm and upperarm .  Satellite erythematous papules. Vesicles with honey colored fluid in center of plaques with multple abrasions and crusting.  small area extending to neck and chest.  Psychiatric: She has a normal mood and affect. Her behavior is normal.   Filed Vitals:   09/26/13 1015  BP: 110/62  Pulse: 68  Temp: 97.6 F (36.4 C)  TempSrc: Oral  Resp: 18  Height: 4\' 11"  (1.499 m)  Weight: 115 lb (52.164 kg)  SpO2: 100%   DIAGNOSTIC STUDIES: Oxygen Saturation is 100% on room air, normal by my interpretation.       Assessment & Plan:   Contact dermatitis due to poison ivy - Plan: methylPREDNISolone acetate (DEPO-MEDROL) injection 120 mg  COORDINATION OF CARE:  10:58 AM Discussed course of care with pt which includes washing her bedding, changing towels after each shower, and wearing longer sleeves . Advised pt to return if rash worsens due to secondary infection. Pt understands and agrees.   Meds ordered this encounter  Medications  . methylPREDNISolone acetate (DEPO-MEDROL) injection 120 mg  Sig:   . triamcinolone cream (KENALOG) 0.1 %    Sig: Apply 1 application topically 4 (four) times daily as needed.    Dispense:  454 g    Refill:  0  . hydrOXYzine (ATARAX/VISTARIL) 25 MG tablet    Sig: Take 0.5-1 tablets (12.5-25 mg total) by mouth every 8 (eight) hours as needed for itching.    Dispense:  30 tablet    Refill:  0    I personally performed the services described in this documentation, which was scribed in my presence. The recorded  information has been reviewed and considered, and addended by me as needed.  Delman Cheadle, MD MPH

## 2013-09-29 ENCOUNTER — Emergency Department (HOSPITAL_COMMUNITY)
Admission: EM | Admit: 2013-09-29 | Discharge: 2013-09-29 | Disposition: A | Discharge status: Home / Self Care | Payer: BC Managed Care – PPO | Source: Home / Self Care | Attending: Family Medicine | Admitting: Family Medicine

## 2013-09-29 ENCOUNTER — Encounter (HOSPITAL_COMMUNITY): Payer: Self-pay | Admitting: Emergency Medicine

## 2013-09-29 DIAGNOSIS — L237 Allergic contact dermatitis due to plants, except food: Secondary | ICD-10-CM

## 2013-09-29 DIAGNOSIS — L255 Unspecified contact dermatitis due to plants, except food: Secondary | ICD-10-CM

## 2013-09-29 MED ORDER — PREDNISONE 20 MG PO TABS
60.0000 mg | ORAL_TABLET | Freq: Once | ORAL | Status: AC
  Administered 2013-09-29: 60 mg via ORAL

## 2013-09-29 MED ORDER — PREDNISONE 10 MG PO KIT: PACK | ORAL | Status: DC

## 2013-09-29 MED ORDER — PREDNISONE 20 MG PO TABS
ORAL_TABLET | ORAL | Status: AC
  Filled 2013-09-29: qty 3

## 2013-09-29 NOTE — Discharge Instructions (Signed)
Gacias por venir hoy.    Hiedra venenosa  (Poison Ivy) Luego de la exposicin previa a la planta. La erupcin suele aparecer 48 horas despus de la exposicin. Suelen ser bultos (ppulas) o ampollas (vesculas) en un patrn lineal. abrirse. Los ojos tambin podran hincharse. Las hinchazn es peor por la maana y mejora a medida que Magazine features editor. Deben tomarse todas las precauciones para prevenir una infeccin bacteriana (por grmenes) secundaria, que puede ocasionar cicatrices. Mantenga todas las reas abiertas secas, limpias y vendadas y cbralas con un ungento antibacteriano, en caso que lo necesite. Si no aparece una infeccin secundaria, esta dermatitis generalmente se [place] 2 o 3 semanas sin tratamiento. INSTRUCCIONES PARA EL CUIDADO DOMICILIARIO Lvese cuidadosamente con agua y jabn tan pronto como ocurra la exposicin al txico. Tiene alrededor de media hora para retirar la resina de la planta antes de que le cause el sarpullido. El lavado destruir rpidamente el aceite o antgeno que se encuentra sobre la piel y que podr causar el sarpullido. Lave enrgicamente debajo de las uas. Todo resto de resina seguir diseminando el sarpullido. No se frote la piel vigorosamente cuando lava la zona afectada. La dermatitis no se extender si retira todo el aceite de la planta que haya quedado en su cuerpo. Un sarpullido que se ha transformado en lesiones que supuran (llagas) no diseminar el sarpullido, a menos que no se haya lavado cuidadosamente. Tambin es importante lavar todas las prendas que [place]. Pueden tener alrgenos [employer]. El sarpullido volver, an varios das ms tarde. La mejor medida es evitar el contacto con la planta en el futuro. La hiedra venenosa puede reconocerse por el nmero de hojas, En general, la hiedra venenosa tiene tres hojas con ramas floridas en un tallo simple. Podr adquirir difenhidramina que es un medicamento de venta [place], y Pharmacologist segn lo  necesite para Barrister's clerk. No conduzca automviles si este medicamento le produce somnolencia. Consulte con el profesional que lo asiste acerca de los medicamentos que podr administrarle a los nios. SOLICITE ATENCIN MDICA SI:  Observa reas abiertas.  Enrojecimiento que se extiende ms all de la zona del sarpullido.  Una secrecin purulenta (similar al pus).  Aumento del dolor.  Desarrolla otros signos de infeccin (como fiebre). Document Released: 01/29/2005 Document Revised: 07/14/2011 [facility] Patient Information 2014 [place], [place].

## 2013-09-29 NOTE — ED Provider Notes (Signed)
Lisa Villarreal is a 38 y.o. female who [facility] today for poison ivy. Patient developed rash across her trunk and upper extremities to 3 days ago after being exposed to poison ivy. She was seen at another urgent care 3 days ago and was given a Depo-Medrol injection which worked for about one day. She was additionally given triamcinolone cream which has not helped much. She denies any fevers chills or nausea vomiting or diarrhea. She denies any trouble breathing. No new medications. She feels well otherwise.   Past Medical History  Diagnosis Date  . Headache, migraine    History  Substance Use Topics  . Smoking status: Never Smoker   . Smokeless tobacco: Never Used  . Alcohol Use: No   ROS as above Medications: No current facility-administered medications for this encounter.   Current Outpatient Prescriptions  Medication Sig Dispense Refill  . hydrOXYzine (ATARAX/VISTARIL) 25 MG tablet Take 0.5-1 tablets (12.5-25 mg total) by mouth every 8 (eight) hours as needed for itching.  30 tablet  0  . Ibuprofen (ADVIL) 200 MG CAPS Take 2 capsules by mouth daily as needed (headache).      . Multiple Vitamin (MULTIVITAMIN) tablet Take 1 tablet by mouth daily.      . Omega-3 Fatty Acids (FISH OIL PO) Take 1 tablet by mouth daily.      . PredniSONE 10 MG KIT 12 day dose pack po  1 kit  0  . triamcinolone cream (KENALOG) 0.1 % Apply 1 application topically 4 (four) times daily as needed.  454 g  0    Exam:  BP 126/79  Pulse 76  Temp(Src) 98.2 F (36.8 C) (Oral)  Resp 18  SpO2 98%  LMP 09/04/2013 Gen: Well NAD nontoxic appearing HEENT: EOMI,  MMM no mucocutaneous involvement Lungs: Normal work of breathing. CTABL Heart: RRR no MRG Abd: NABS, Soft. NT, ND Exts: Brisk capillary refill, warm and well perfused.  Skin: Erythematous macular rash with vesicles across both trunks and extremities in patches  No results found for this or any previous visit (from the past 24  hour(s)). No results found.  Assessment and Plan: 38 y.o. female with poison ivy dermatitis. Plan to use a longer prednisone course. Followup as needed.  Discussed warning signs or symptoms. Please see discharge instructions. Patient expresses understanding.    Gregor Hams, MD 09/29/13 [phone]

## 2013-09-29 NOTE — ED Notes (Signed)
Pt c/o persistent poison ivy onset 3 days Seen at [facility]; given IM inj of DepoMedrol and Triamcinolone Ras on arms, chest Feels like its "burning" Alert w/no signs of acute distress.

## 2013-11-17 ENCOUNTER — Ambulatory Visit (INDEPENDENT_AMBULATORY_CARE_PROVIDER_SITE_OTHER): Payer: BC Managed Care – PPO | Admitting: Obstetrics & Gynecology

## 2013-11-17 ENCOUNTER — Encounter: Payer: Self-pay | Admitting: Obstetrics & Gynecology

## 2013-11-17 VITALS — BP 112/71 | HR 76 | Temp 96.9°F | Ht <= 58 in | Wt 116.6 lb

## 2013-11-17 DIAGNOSIS — Z01419 Encounter for gynecological examination (general) (routine) without abnormal findings: Secondary | ICD-10-CM

## 2013-11-17 DIAGNOSIS — N644 Mastodynia: Secondary | ICD-10-CM

## 2013-11-17 NOTE — Progress Notes (Signed)
Patient ID: Lisa Villarreal, female   DOB: 1975/12/26, 38 y.o.   MRN: [id number] Subjective:     Lisa Villarreal is a 38 y.o. female here for a routine exam.  Current complaints: none.     Gynecologic History Patient's last menstrual period was 11/16/2013. Last Pap: 08/2012. Results were: normal Last mammogram: 01/2013. Results were: normal  Obstetric History OB History  Gravida Para Term Preterm AB SAB TAB Ectopic Multiple Living  4 3 3  0 1 1 0 0 0 3    # Outcome Date GA Lbr Len/2nd Weight Sex Delivery Anes PTL Lv  4 TRM 2005     SVD     3 TRM 2003     SVD     2 TRM 1999     CCS     1 SAB                The following portions of the patient's history were reviewed and updated as appropriate: allergies, current medications, past family history, past medical history, past social history, past surgical history and problem list.  Review of Systems A comprehensive review of systems was negative.    Objective:    BP 112/71  Pulse 76  Temp(Src) 96.9 F (36.1 C) (Oral)  Ht 4\' 10"  (1.473 m)  Wt 116 lb 9.6 oz (52.889 kg)  BMI 24.38 kg/m2  LMP 11/16/2013  General Appearance:    Alert, cooperative, no distress, appears stated age                 Neck:   Supple, symmetrical, trachea midline, no adenopathy;    thyroid:  no enlargement/tenderness/nodules; no carotid   bruit or JVD  Back:     Symmetric, no curvature, ROM normal, no CVA tenderness  Lungs:     Clear to auscultation bilaterally, respirations unlabored  Chest Wall:    No tenderness or deformity   Heart:    Regular rate and rhythm, S1 and S2 normal, no murmur, rub   or gallop  Breast Exam:    No tenderness, masses, or nipple abnormality  Abdomen:     Soft, non-tender, bowel sounds active all four quadrants,    no masses, no organomegaly  Genitalia:    Normal female without lesion, discharge or tenderness  Rectal:  Not done  Extremities:   Extremities normal, atraumatic, no cyanosis or edema  Pulses:    2+ and symmetric all extremities  Skin:   Skin color, texture, turgor normal, no rashes or lesions  Lymph nodes:   Cervical, supraclavicular, and axillary nodes normal         Assessment:    Healthy female exam.  No PAP done  left breast pain- prev abnormal mammogram  Plan:    Follow up in: 1 year.   Diagnostic mammogram to eval left breat pain and mass

## 2013-11-17 NOTE — Patient Instructions (Signed)
Mamografa (Mammography) La mamografa es un tipo de radiografa de las mamas que se realiza para observar si hay cambios que no son normales. Este tipo de radiografa se denomina mamografa. Con este procedimiento se puede estudiar el cncer de mama, detectarlo de manera temprana, y diagnosticar cncer.  INFORME A SU MDICO SOBRE:   Implantes mamarios.  Enfermedades, biopsias o cirugas previas de la mama.  Si est amamantando.  Medicamentos que utiliza, incluyendo vitaminas, hierbas, gotas oftlmicas, medicamentos de venta libre y cremas.  Uso de esteroides (por va oral o cremas).  Posibilidad de embarazo, si correspondiera. RIESGOS Y COMPLICACIONES   Exposicin a la radiacin, pero a niveles muy bajos.  Los resultados pueden estar mal interpretados.  Los resultados pueden no ser precisos.  La mamografa puede conducir a pruebas adicionales.  Puede ser que no detecte ciertos tipos de cncer. ANTES DEL PROCEDIMIENTO  Programe su prueba para aproximadamente 7 das despus de tener su perodo menstrual. En este momento las mamas estn menos sensibles y hay signos de cambios hormonales.  Si usted se ha hecho una mamografa en un establecimiento diferente en el pasado, trate de conseguirla o que la enven al nuevo establecimiento con el fin de compararlas.  El da del examen, lave sus mamas y la zona de las axilas.  No use desodorante, perfume o talco en ningn lugar de su cuerpo.  Use prendas que pueda ponerse y sacarse fcilmente. PROCEDIMIENTO Durante el procedimiento reljese tanto como le sea posible. Cualquier molestia durante la prueba ser muy leve. La ecografa demora menos de 30 minutos. Esto ocurrir:   Tendr que desvestirse de la cintura para arriba y ponerse una bata de hospital.  Se pondr de pie delante de la mquina de rayos-X.  Se coloca cada mama entre dos placas de vidrio o plstico. Las placas comprimirn los senos durante unos segundos.  Se tomar  una radiografa en diferentes ngulos de la mama. . DESPUS DEL PROCEDIMIENTO  La mamografa ser examinada.  Dependiendo de la calidad de las imgenes, es posible que tenga que repetir ciertas partes de la prueba.  Consulte con su mdico la fecha en que los resultados estarn disponibles. Asegrese de obtener los resultados.  Puede retomar sus actividades habituales. Document Released: 01/29/2005 Document Revised: 07/14/2011 [employer] Patient Information 2015 [employer]. This information is not intended to replace advice given to you by your health care provider. Make sure you discuss any questions you have with your health care provider.  

## 2013-11-21 ENCOUNTER — Telehealth: Payer: Self-pay

## 2013-11-21 ENCOUNTER — Other Ambulatory Visit: Payer: Self-pay | Admitting: Obstetrics & Gynecology

## 2013-11-21 DIAGNOSIS — Z01419 Encounter for gynecological examination (general) (routine) without abnormal findings: Secondary | ICD-10-CM

## 2013-11-21 DIAGNOSIS — N644 Mastodynia: Secondary | ICD-10-CM

## 2013-11-21 NOTE — Telephone Encounter (Signed)
Mammogram scheduled at breast center-- will be left sided only due to patient having bilateral mammo in September-- U/S to be done as well. Appointment 11/29/13 1245. Patient needs to be informed.

## 2013-11-21 NOTE — Telephone Encounter (Signed)
Message copied by Geanie Logan on Mon Nov 21, 2013  9:51 AM ------      Message from: Christiana Pellant A      Created: Thu Nov 17, 2013  4:41 PM       Patient left before being scheduled for mammogram due to breast pain. It needs to be done at the breast center.  ------

## 2013-11-21 NOTE — Telephone Encounter (Signed)
Called patient with interpreter. Patient informed of appointment date, time and location as well as the number to call if she needs to reschedule. Advised patient not to wear deodorant, lotions or powders this day. Patient verbalized understanding.

## 2013-11-29 ENCOUNTER — Ambulatory Visit
Admission: RE | Admit: 2013-11-29 | Discharge: 2013-11-29 | Disposition: A | Discharge status: Home / Self Care | Payer: BC Managed Care – PPO | Source: Ambulatory Visit | Attending: Obstetrics & Gynecology | Admitting: Obstetrics & Gynecology

## 2013-11-29 DIAGNOSIS — Z01419 Encounter for gynecological examination (general) (routine) without abnormal findings: Secondary | ICD-10-CM

## 2013-11-29 DIAGNOSIS — N644 Mastodynia: Secondary | ICD-10-CM

## 2014-03-06 ENCOUNTER — Encounter: Payer: Self-pay | Admitting: Obstetrics & Gynecology

## 2014-08-15 ENCOUNTER — Ambulatory Visit (HOSPITAL_COMMUNITY)
Admission: RE | Admit: 2014-08-15 | Discharge: 2014-08-15 | Disposition: A | Discharge status: Home / Self Care | Payer: 59 | Source: Ambulatory Visit | Attending: Internal Medicine | Admitting: Internal Medicine

## 2014-08-15 ENCOUNTER — Encounter: Payer: Self-pay | Admitting: Internal Medicine

## 2014-08-15 ENCOUNTER — Ambulatory Visit (INDEPENDENT_AMBULATORY_CARE_PROVIDER_SITE_OTHER): Payer: 59 | Admitting: Internal Medicine

## 2014-08-15 VITALS — BP 112/85 | HR 77 | Temp 98.0°F | Ht 60.02 in | Wt 121.5 lb

## 2014-08-15 DIAGNOSIS — M546 Pain in thoracic spine: Secondary | ICD-10-CM

## 2014-08-15 DIAGNOSIS — M549 Dorsalgia, unspecified: Secondary | ICD-10-CM

## 2014-08-15 DIAGNOSIS — R0789 Other chest pain: Secondary | ICD-10-CM | POA: Insufficient documentation

## 2014-08-15 NOTE — Patient Instructions (Signed)
1. Please try relaxation techniques for your anxiety. Also, try stretching your back muscles (especially prior to running).  Try ice or heat to your back when you get this discomfort to see if it helps.  Come back to see Dr. Raelene Bott in 2 weeks for follow-up.    2. Please take all medications as prescribed.     3. If you have worsening of your symptoms or new symptoms arise, please call the clinic ([phone]), or go to the ER immediately if symptoms are severe.

## 2014-08-15 NOTE — Progress Notes (Signed)
Internal Medicine Clinic Attending  Case discussed with Dr. Wilson at the time of the visit.  We reviewed the resident's history and exam and pertinent patient test results.  I agree with the assessment, diagnosis, and plan of care documented in the resident's note.  

## 2014-08-15 NOTE — Progress Notes (Signed)
   Subjective:    Patient ID: Lisa Villarreal, female    DOB: 12/30/1975, 39 y.o.   MRN: [id number]  HPI Comments: [name] is a 39 year old woman with PMH as below here to re-establish care with clinic and with complaint of back discomfort x 3 months.   Please see problem based charting for A&P.     Past Medical History  Diagnosis Date  . Headache, migraine     Review of Systems  Constitutional: Negative for fever, chills and appetite change.  Eyes: Negative for visual disturbance.  Respiratory: Positive for shortness of breath. Negative for cough.        Occasional dyspnea after the throbbing sensation.  Denies cough or hemoptysis.    Cardiovascular: Positive for palpitations.  Gastrointestinal: Negative for nausea, vomiting, abdominal pain, diarrhea, constipation and blood in stool.  Genitourinary: Negative for dysuria, hematuria and difficulty urinating.  Neurological: Positive for dizziness. Negative for syncope, light-headedness and headaches.       Filed Vitals:   08/15/14 1619 08/15/14 1731 08/15/14 1732  BP: 105/60 115/64 112/85  Pulse: 78 78 77  Temp: 98 F (36.7 C)    TempSrc: Oral    Height: 5' 0.02" (1.525 m)    Weight: 121 lb 8 oz (55.112 kg)    SpO2: 100%     Objective:   Physical Exam  Constitutional: She is oriented to person, place, and time. She appears well-developed. No distress.  HENT:  Head: Normocephalic and atraumatic.  Mouth/Throat: Oropharynx is clear and moist. No oropharyngeal exudate.  Eyes: Conjunctivae and EOM are normal. Pupils are equal, round, and reactive to light.  Neck: Neck supple.  Cardiovascular: Normal rate, regular rhythm and normal heart sounds.  Exam reveals no gallop and no friction rub.   No murmur heard. Pulmonary/Chest: Effort normal and breath sounds normal. No respiratory distress. She has no wheezes. She has no rales.  Abdominal: Soft. Bowel sounds are normal. She exhibits no distension and no mass.  There is no tenderness. There is no rebound and no guarding.  Musculoskeletal: Normal range of motion. She exhibits no edema or tenderness.  There is no tenderness to palpation of the spine.  The paraspinal musculature feels normal and is not tender to palpation.  I am unable to elicit the pain with movement of her left arm.  The skin looks normal and there are no rashes in the area.  Neurological: She is alert and oriented to person, place, and time. She displays normal reflexes. No cranial nerve deficit. She exhibits normal muscle tone.  5/5 motor strength upper and lower extremities.  Skin: Skin is warm. She is not diaphoretic.  Psychiatric: She has a normal mood and affect. Her behavior is normal. Judgment and thought content normal.  Vitals reviewed.         Assessment & Plan:  Please see problem based charting for A&P.

## 2014-08-16 DIAGNOSIS — M549 Dorsalgia, unspecified: Secondary | ICD-10-CM | POA: Insufficient documentation

## 2014-08-16 NOTE — Assessment & Plan Note (Addendum)
She reports throbbing, stabbing sensation between shoulder blades x 3 months.  She denies back pain but does feel chest pressure and dyspnea while she has the back discomfort.  The discomfort lasts about 15-20 seconds.  It happens when she is stressed.  Occurs about 3-4 times per week.  She exercises regularly, running several miles on her treadmill.  She did not have any change to her workout routine and cannot think of any precipitating factors 3 months ago.  If she mentally relaxes it goes away.  If she thinks about it this brings on the sensation.  She is stressed because she thinks this may indicate a problem in her lungs.  She denies current or past smoking, denies drugs/EtOH, hx of lung disease or lung infections.  She is hemodynamically stable with a completely normal physical exam (including clear lung fields).  Her young age, lack of morbidity, physical activity (regular exercise), lack of family hx make her low risk for ACS.  The pain is left paraspinal (not central), is not ripping or severe, she is not hypertensive and young age and chronicity of complaint make aortic dissection extremely unlikely.  She denies use of OCP or other hormonal birth control, recent long car or airplane travel, leg pain or edema, and SpO2 100% so PE is very unlikely.  She reports dyspnea but has normal lung exam and oxygen saturation so I do not suspect lung pathology.  Her abdominal exam is normal and there are no GI symptoms so I do not suspect referred pain from GI source.  I am unable to reproduce her discomfort by palpation, the paraspinal musculature is not hypertonic and the timing of discomfort does not really fit with MSK etiology.  I do not see a physical cause for her symptoms.  Given the relation to stress, the ability for her to think about the problem and have it occur, her hx of generalized anxiety disorder, I think these symptoms are likely due to anxiety.  The cycle is perpetuated because the more anxious she  gets that something is wrong with her the more she feels the discomfort.  She seemed really relieved when I discussed the above with her.   - EKG - NSR - BP both arms - normal - patient advised to try relaxation and stretching when she feels stressed - advised to try ice or heat to paraspinal musculature to see if this helps (may be more placebo) - return to [facility] in 2 weeks for follow-up with PCP; I would advise that she take the anxiety questionnaire at that time and consider treatment if needed.

## 2014-08-29 ENCOUNTER — Ambulatory Visit: Payer: 59 | Admitting: Internal Medicine

## 2014-09-05 ENCOUNTER — Ambulatory Visit: Payer: 59 | Admitting: Internal Medicine

## 2014-09-06 ENCOUNTER — Encounter: Payer: Self-pay | Admitting: Internal Medicine

## 2014-09-06 ENCOUNTER — Ambulatory Visit (INDEPENDENT_AMBULATORY_CARE_PROVIDER_SITE_OTHER): Payer: 59 | Admitting: Internal Medicine

## 2014-09-06 VITALS — BP 105/57 | HR 73 | Temp 97.9°F | Ht 60.0 in | Wt 120.8 lb

## 2014-09-06 DIAGNOSIS — F411 Generalized anxiety disorder: Secondary | ICD-10-CM | POA: Diagnosis not present

## 2014-09-06 NOTE — Progress Notes (Signed)
   Subjective:   Patient ID: Lisa Villarreal female   DOB: [birth year] 39 y.o.   MRN: [id number]  HPI: Lisa Villarreal is a 39 y.o. Spanish-speaking woman with a past medical history as listed below who presents for follow up regarding her anxiety and is accompanied by a translator who helps provide the history.  Patient states that she has had significant improvement in her "back spasm" pain that she originally presented with on 08/21/14. Extensive workup at that time showed no etiology regarding her cardiopulmonary system and all those tests were reviewed with the patient. The patient clearly make an association with increased anxiety and the beginning of that back pain. She says through breathing techniques and imagery she is able to abort the pain. The patient states that these episodes are only happening about once per week when she is feeling extra flustered or busy and only last for about 5 minutes. She does not have any palpitations significant shortness of breath nausea vomiting or presyncopal symptoms.  Past Medical History  Diagnosis Date  . Headache, migraine    Current Outpatient Prescriptions  Medication Sig Dispense Refill  . Ibuprofen (ADVIL) 200 MG CAPS Take 2 capsules by mouth daily as needed (headache).     No current facility-administered medications for this visit.   Family History  Problem Relation Age of Onset  . Leukemia Maternal Aunt    History   Social History  . Marital Status: Married    Spouse Name: N/A  . Number of Children: N/A  . Years of Education: N/A   Social History Main Topics  . Smoking status: Never Smoker   . Smokeless tobacco: Never Used  . Alcohol Use: No  . Drug Use: No  . Sexual Activity: Yes    Birth Control/ Protection: Condom   Other Topics Concern  . None   Social History Narrative   Financial assistance approved for 100% discount at [facility] and has [facility] card.   Review of Systems: Pertinent items are noted in  HPI. Objective:  Physical Exam: Filed Vitals:   09/06/14 1417  BP: 105/57  Pulse: 73  Temp: 97.9 F (36.6 C)  TempSrc: Oral  Height: 5' (1.524 m)  Weight: 120 lb 12.8 oz (54.795 kg)  SpO2: 100%   General: Sitting in chair, NAD Cardiac: RRR, no rubs, murmurs or gallops Pulm: clear to auscultation bilaterally, moving normal volumes of air Abd: soft, nontender, nondistended, BS present Ext: warm and well perfused, no pedal edema  Assessment & Plan:  Please see problem oriented charting  Pt discussed with Dr. Dareen Piano

## 2014-09-06 NOTE — Patient Instructions (Signed)
General Instructions:   Please bring your medicines with you each time you come to clinic.  Medicines may include prescription medications, over-the-counter medications, herbal remedies, eye drops, vitamins, or other pills.   Progress Toward Treatment Goals:  No flowsheet data found.  Self Care Goals & Plans:  No flowsheet data found.  No flowsheet data found.   Care Management & Community Referrals:  No flowsheet data found.      

## 2014-09-06 NOTE — Assessment & Plan Note (Signed)
The patient has had episodes of generalized anxiety in the past. The patient presents with similar symptoms and complaints. Extensive discussion regarding possible medications were had and CBT therapy. The patient feels that she is doing well with these relaxation techniques and breathing exercises and feels that she is "getting a handle on her anxiety." -Education materials regarding stress techniques, understanding anxiety triggers, and breathing exercises was provided in Spanish for the patient -Resources regarding possible CBT therapy and medication options was also given when the patient is ready -Patient was told to present if symptoms are getting worse or new symptoms occur

## 2014-09-08 NOTE — Progress Notes (Signed)
INTERNAL MEDICINE TEACHING ATTENDING ADDENDUM - Desmond Szabo, MD: I reviewed and discussed at the time of visit with the resident Dr. Sadek, the patient's medical history, physical examination, diagnosis and results of pertinent tests and treatment and I agree with the patient's care as documented.  

## 2014-09-12 ENCOUNTER — Encounter: Payer: Self-pay | Admitting: *Deleted

## 2015-01-26 ENCOUNTER — Encounter: Payer: Self-pay | Admitting: Internal Medicine

## 2015-01-26 ENCOUNTER — Ambulatory Visit (INDEPENDENT_AMBULATORY_CARE_PROVIDER_SITE_OTHER): Payer: 59 | Admitting: Internal Medicine

## 2015-01-26 VITALS — BP 108/54 | HR 76 | Temp 98.1°F | Ht 60.0 in | Wt 115.7 lb

## 2015-01-26 DIAGNOSIS — F411 Generalized anxiety disorder: Secondary | ICD-10-CM

## 2015-01-26 DIAGNOSIS — L819 Disorder of pigmentation, unspecified: Secondary | ICD-10-CM | POA: Diagnosis not present

## 2015-01-26 DIAGNOSIS — Z23 Encounter for immunization: Secondary | ICD-10-CM

## 2015-01-26 DIAGNOSIS — Z0000 Encounter for general adult medical examination without abnormal findings: Secondary | ICD-10-CM

## 2015-01-26 DIAGNOSIS — B360 Pityriasis versicolor: Secondary | ICD-10-CM

## 2015-01-26 MED ORDER — KETOCONAZOLE 2 % EX CREA: 1.0000 "application " | TOPICAL_CREAM | Freq: Every day | CUTANEOUS | Status: DC

## 2015-01-26 NOTE — Patient Instructions (Signed)
Apply topical ketoconazole to affected areas of back and arms as needed. If this fails to improve itching and discoloration within the next month please call our clinic to discuss a different treatment.  A [facility] to preform a repeat screening mammogram. You will be called to arrange the details of this appointment.  We will call you with the results of your blood test today. If your iron levels are low we may recommend starting an iron supplement.

## 2015-01-26 NOTE — Progress Notes (Signed)
   Subjective:   Patient ID: Lisa Villarreal female   DOB: Oct 31, 1975 39 y.o.   MRN: [id number]  HPI: Lisa Villarreal is a 39 y.o. with history of generalized anxiety disorder presenting for health maintenance visit and hypopigmentation of the skin. She has noticed worsening of this skin finding over the past 2-4 months. She associates this with more frequent sun exposure and visiting the pool. She has previously tried using head and shoulders shampoo based on family recommendations.  See problem based assessment and plan for additional details.  Past Medical History  Diagnosis Date  . Headache, migraine    Current Outpatient Prescriptions  Medication Sig Dispense Refill  . Ibuprofen (ADVIL) 200 MG CAPS Take 2 capsules by mouth daily as needed (headache).    Marland Kitchen ketoconazole (NIZORAL) 2 % cream Apply 1 application topically daily. To affected areas 30 g 2   No current facility-administered medications for this visit.   Family History  Problem Relation Age of Onset  . Leukemia Maternal Aunt    Social History   Social History  . Marital Status: Married    Spouse Name: N/A  . Number of Children: N/A  . Years of Education: N/A   Social History Main Topics  . Smoking status: Never Smoker   . Smokeless tobacco: Never Used  . Alcohol Use: No  . Drug Use: No  . Sexual Activity: Yes    Birth Control/ Protection: Condom   Other Topics Concern  . None   Social History Narrative   Financial assistance approved for 100% discount at [facility] and has [facility] card.   Review of Systems: Review of Systems  Constitutional: Negative for fever and chills.  Eyes: Negative for blurred vision and double vision.  Respiratory: Negative for shortness of breath.   Cardiovascular: Negative for chest pain and leg swelling.  Gastrointestinal: Negative for nausea, vomiting and abdominal pain.  Genitourinary: Negative for dysuria.  Musculoskeletal: Negative for joint pain.  Skin:  Positive for itching. Negative for rash.  Neurological: Negative for dizziness and headaches.  Endo/Heme/Allergies: Does not bruise/bleed easily.  Psychiatric/Behavioral: The patient is nervous/anxious.     Objective:  Physical Exam: Filed Vitals:   01/26/15 1431  BP: 108/54  Pulse: 76  Temp: 98.1 F (36.7 C)  TempSrc: Oral  Height: 5' (1.524 m)  Weight: 115 lb 11.2 oz (52.481 kg)  SpO2: 100%   GENERAL- alert, fit-appearing, co-operative, NAD HEENT- Atraumatic, PERRL, EOMI, oral mucosa appears moist, good and intact dentition, no cervical LN enlargement. CARDIAC- RRR, no murmurs, rubs or gallops. RESP- CTAB, no wheezes or crackles. ABDOMEN- Soft, nontender, no guarding or rebound, normoactive bowel sounds present BACK- Normal curvature, no paraspinal tenderness, no CVA tenderness. Round approximately 3; her diameter hyperpigmented spot located in center of back beneath bra strap EXTREMITIES- pulse 2+, symmetric, no pedal edema. SKIN- Warm, dry, very small 1-2 mm hypopigmented spots on forearms and distal legs bilaterally and upper back PSYCH- Normal mood and affect, appropriate thought content and speech.    Assessment & Plan:

## 2015-01-26 NOTE — Assessment & Plan Note (Signed)
Acute on chronic hypopigmentation of back bilateral forearms and bilateral distal legs consistent with tinea versicolor. History of increased sun exposure with pools visits is also consistent. Patient has not improved with over-the-counter shampoo products topically applied. No concerning areas of irregularity or hyperpigmentation. Minimal pruritus and no pain.  -Ketoconazole cream applied once daily to affected areas -Patient directed to call clinic if worsens or fails to improve with current treatment plan

## 2015-01-26 NOTE — Assessment & Plan Note (Signed)
Patient is currently doing well continue to screen anxiety but without significant musculoskeletal pain resulting from this. She has been physically active giving some improvement. At this time she does not seek any additional medication or referral to treatment for her anxiety.  - No new interventions indicated at this time

## 2015-01-26 NOTE — Assessment & Plan Note (Signed)
Patient has recent Pap within normal limits. We'll refer to breast center for repeat mammography given past abnormal study last year. - referral sent to breast center for bilateral screening mammography  Patient is also overdue for influenza and Tdap vaccination at this time. - Flu and Tdap vaccines today.  Patient has history of very minimal anemia and required iron supplementation for anemia during her previous pregnancies. She continues to have regular periods. She currently takes multivitamins but no active iron supplementation. No previous anemia panel or screening for iron deficiency. -Serum Ferritin level

## 2015-01-27 LAB — FERRITIN: FERRITIN: 10 ng/mL — AB (ref 15–150)

## 2015-01-27 NOTE — Progress Notes (Signed)
Internal Medicine Clinic Attending  I saw and evaluated the patient.  I personally confirmed the key portions of the history and exam documented by Dr. Rice and I reviewed pertinent patient test results.  The assessment, diagnosis, and plan were formulated together and I agree with the documentation in the resident's note.  

## 2015-02-26 ENCOUNTER — Other Ambulatory Visit: Payer: Self-pay | Admitting: Obstetrics & Gynecology

## 2015-02-26 ENCOUNTER — Other Ambulatory Visit: Payer: Self-pay | Admitting: Internal Medicine

## 2015-02-26 DIAGNOSIS — Z1231 Encounter for screening mammogram for malignant neoplasm of breast: Secondary | ICD-10-CM

## 2015-03-15 ENCOUNTER — Ambulatory Visit: Payer: 59

## 2015-03-22 ENCOUNTER — Ambulatory Visit
Admission: RE | Admit: 2015-03-22 | Discharge: 2015-03-22 | Disposition: A | Discharge status: Home / Self Care | Payer: 59 | Source: Ambulatory Visit | Attending: Obstetrics & Gynecology | Admitting: Obstetrics & Gynecology

## 2015-03-22 DIAGNOSIS — Z1231 Encounter for screening mammogram for malignant neoplasm of breast: Secondary | ICD-10-CM

## 2016-01-21 ENCOUNTER — Ambulatory Visit (INDEPENDENT_AMBULATORY_CARE_PROVIDER_SITE_OTHER): Payer: BLUE CROSS/BLUE SHIELD | Admitting: Family Medicine

## 2016-01-21 VITALS — BP 104/66 | HR 78 | Temp 98.4°F | Resp 18 | Ht 60.0 in | Wt 119.0 lb

## 2016-01-21 DIAGNOSIS — Z1389 Encounter for screening for other disorder: Secondary | ICD-10-CM

## 2016-01-21 DIAGNOSIS — Z1329 Encounter for screening for other suspected endocrine disorder: Secondary | ICD-10-CM | POA: Diagnosis not present

## 2016-01-21 DIAGNOSIS — Z130 Encounter for screening for diseases of the blood and blood-forming organs and certain disorders involving the immune mechanism: Secondary | ICD-10-CM | POA: Diagnosis not present

## 2016-01-21 DIAGNOSIS — Z1383 Encounter for screening for respiratory disorder NEC: Secondary | ICD-10-CM

## 2016-01-21 DIAGNOSIS — E559 Vitamin D deficiency, unspecified: Secondary | ICD-10-CM | POA: Diagnosis not present

## 2016-01-21 DIAGNOSIS — Z124 Encounter for screening for malignant neoplasm of cervix: Secondary | ICD-10-CM | POA: Diagnosis not present

## 2016-01-21 DIAGNOSIS — Z136 Encounter for screening for cardiovascular disorders: Secondary | ICD-10-CM

## 2016-01-21 DIAGNOSIS — Z23 Encounter for immunization: Secondary | ICD-10-CM | POA: Diagnosis not present

## 2016-01-21 DIAGNOSIS — Z0000 Encounter for general adult medical examination without abnormal findings: Secondary | ICD-10-CM

## 2016-01-21 DIAGNOSIS — Z1231 Encounter for screening mammogram for malignant neoplasm of breast: Secondary | ICD-10-CM

## 2016-01-21 DIAGNOSIS — D509 Iron deficiency anemia, unspecified: Secondary | ICD-10-CM

## 2016-01-21 LAB — POCT URINALYSIS DIP (MANUAL ENTRY)
BILIRUBIN UA: NEGATIVE
Bilirubin, UA: NEGATIVE
Glucose, UA: NEGATIVE
LEUKOCYTES UA: NEGATIVE
Nitrite, UA: NEGATIVE
PROTEIN UA: NEGATIVE
Spec Grav, UA: 1.025
Urobilinogen, UA: 0.2
pH, UA: 5

## 2016-01-21 NOTE — Progress Notes (Signed)
Subjective:  By signing my name below, I, Lisa Villarreal, attest that this documentation has been prepared under the direction and in the presence of Delman Cheadle, MD. Electronically Signed: Moises Villarreal, [place]. 01/21/2016 , 6:04 PM .  Patient was seen in Room 8 .   Patient ID: Lisa Villarreal, female    DOB: 1975-12-13, 40 y.o.   MRN: [id number] Chief Complaint  Patient presents with  . Annual Exam  . Flu Vaccine   HPI Lisa Villarreal is a 40 y.o. female who presents to [facility] for annual physical. [facility]. She has a history of general anxiety disorder, and was having myalgias from this. Improved with exercise, so no medication were tried. Her last pap smear was 08/22/12, which was normal. She does have a history of abnormal mammogram with left breast mass 01/2013, done at the [facility]. Last mammogram was Nov 2016 at the breast center was negative. History of very mild anemia with regular menses. Tdap done 01/2015. Last routine labs done in 2014; lipid panel was excellent.   She's doing generally well. She denies history of abnormal pap smear. She mentions her left breast lump, which is most likely a benign cyst, and has mammogram done annually.   Her diet mainly consists of fruits and veggies. She eats a healthy breakfast with fruit smoothies. She does eat some meats. She denies taking vitamins and iron supplements. She exercises about 20 minutes, 5 days a week.   She mentions having back discomfort when she becomes stressed, but this would resolve after she becomes relaxed within 2~3 minutes. She denies taking ibuprofen when this occurs. She sees her dentist every 6 months for cleaning; denies any history of cavities.   She also requested flu vaccine.   Past Medical History:  Diagnosis Date  . Headache, migraine    Prior to Admission medications   Medication Sig Start Date End Date Taking? Authorizing Provider  aspirin EC  81 MG tablet Take 81 mg by mouth daily.   Yes Historical Provider, MD   Allergies  Allergen Reactions  . Penicillins     REACTION: Swelling   Past Surgical History:  Procedure Laterality Date  . CESAREAN SECTION     Family History  Problem Relation Age of Onset  . Leukemia Maternal Aunt    Social History   Social History  . Marital status: Married    Spouse name: N/A  . Number of children: N/A  . Years of education: N/A   Social History Main Topics  . Smoking status: Never Smoker  . Smokeless tobacco: Never Used  . Alcohol use No  . Drug use: No  . Sexual activity: Yes    Birth control/ protection: Condom   Other Topics Concern  . None   Social History Narrative   Financial assistance approved for 100% discount at [facility] and has [facility] card.   Depression screen [facility] 2/9 01/21/2016 01/26/2015 09/06/2014 08/15/2014 11/15/2012  Decreased Interest 0 0 0 0 0  Down, Depressed, Hopeless 0 0 0 0 0  PHQ - 2 Score 0 0 0 0 0    Review of Systems  Constitutional: Negative for chills, fatigue, fever and unexpected weight change.  Respiratory: Negative for cough.   Gastrointestinal: Negative for constipation, diarrhea, nausea and vomiting.  Skin: Negative for rash and wound.  Neurological: Negative for dizziness, weakness and headaches.  All other systems reviewed and are negative.      Objective:  Physical Exam  Constitutional: She is oriented to person, place, and time. She appears well-developed and well-nourished. No distress.  HENT:  Head: Normocephalic and atraumatic.  Right Ear: Tympanic membrane, external ear and ear canal normal.  Left Ear: Tympanic membrane, external ear and ear canal normal.  Nose: Nose normal.  Mouth/Throat: Uvula is midline, oropharynx is clear and moist and mucous membranes are normal. No posterior oropharyngeal erythema.  Eyes: Conjunctivae and EOM are normal. Pupils are equal, round, and reactive to light. Right eye exhibits no discharge. Left  eye exhibits no discharge. No scleral icterus.  Neck: Normal range of motion. Neck supple. No thyromegaly present.  Cardiovascular: Normal rate, regular rhythm, normal heart sounds and intact distal pulses.   Pulmonary/Chest: Effort normal and breath sounds normal. No respiratory distress.  Abdominal: Soft. Bowel sounds are normal. There is no tenderness.  Genitourinary: Vagina normal and uterus normal. No breast swelling, tenderness, discharge or bleeding. Cervix exhibits no motion tenderness and no friability. Right adnexum displays no mass and no tenderness. Left adnexum displays no mass and no tenderness.  Musculoskeletal: Normal range of motion. She exhibits no edema.  Lymphadenopathy:    She has no cervical adenopathy.  Neurological: She is alert and oriented to person, place, and time. She has normal reflexes.  Skin: Skin is warm and dry. She is not diaphoretic. No erythema.  Psychiatric: She has a normal mood and affect. Her behavior is normal.  Nursing note and vitals reviewed.   BP 104/66 (BP Location: Right Arm, Patient Position: Sitting, Cuff Size: Small)   Pulse 78   Temp 98.4 F (36.9 C) (Oral)   Resp 18   Ht 5' (1.524 m)   Wt 119 lb (54 kg)   LMP 01/13/2016   SpO2 99%   BMI 23.24 kg/m     Assessment & Plan:   1. Annual physical exam   2. Encounter for screening mammogram for breast cancer   3. Screening for cardiovascular, respiratory, and genitourinary diseases   4. Screening for cervical cancer   5. Screening for deficiency anemia   6. Screening for thyroid disorder   7. Need for prophylactic vaccination and inoculation against influenza   8. Vitamin D deficiency   9. Iron deficiency anemia    Increase iron in diet.  Orders Placed This Encounter  Procedures  . Flu Vaccine QUAD 36+ mos IM  . CBC  . Comprehensive metabolic panel  . TSH  . VITAMIN D 25 Hydroxy (Vit-D Deficiency, Fractures)  . Ferritin  . POCT urinalysis dipstick    Meds ordered this  encounter  Medications  . aspirin EC 81 MG tablet    Sig: Take 81 mg by mouth daily.  . Vitamin D, Ergocalciferol, (DRISDOL) 50000 units CAPS capsule    Sig: Take 1 capsule (50,000 Units total) by mouth every 7 (seven) days.    Dispense:  24 capsule    Refill:  0  . ferrous sulfate 325 (65 FE) MG tablet    Sig: Take 1 tablet (325 mg total) by mouth daily with breakfast.    Dispense:  90 tablet    Refill:  0    I personally performed the services described in this documentation, which was scribed in my presence. The recorded information has been reviewed and considered, and addended by me as needed.   Delman Cheadle, M.D.  [facility] [address] [place], [place] [address] [phone] phone [phone] fax  02/03/16 10:37  PM

## 2016-01-21 NOTE — Patient Instructions (Addendum)
IF you received an x-ray today, you will receive an invoice from [facility] Radiology. Please contact [facility] at [phone] with questions or concerns regarding your invoice.   IF you received labwork today, you will receive an invoice from [employer]. Please contact [facility] at [phone] with questions or concerns regarding your invoice.   Our billing staff will not be able to assist you with questions regarding bills from these companies.  You will be contacted with the lab results as soon as they are available. The fastest way to get your results is to activate your My Chart account. Instructions are located on the last page of this paperwork. If you have not heard from [place] regarding the results in 2 weeks, please contact this office.    Iron-Rich Diet Iron is a mineral that helps your body to produce hemoglobin. Hemoglobin is a protein in your red blood cells that carries oxygen to your body's tissues. Eating too little iron may cause you to feel weak and tired, and it can increase your risk for infection. Eating enough iron is necessary for your body's metabolism, muscle function, and nervous system. Iron is naturally found in many foods. It can also be added to foods or fortified in foods. There are two types of dietary iron:  Heme iron. Heme iron is absorbed by the body more easily than nonheme iron. Heme iron is found in meat, poultry, and fish.  Nonheme iron. Nonheme iron is found in dietary supplements, iron-fortified grains, beans, and vegetables. You may need to follow an iron-rich diet if:  You have been diagnosed with iron deficiency or iron-deficiency anemia.  You have a condition that prevents you from absorbing dietary iron, such as:  Infection in your intestines.  Celiac disease. This involves long-lasting (chronic) inflammation of your intestines.  You do not eat enough iron.  You eat a diet that is high in foods that  impair iron absorption.  You have lost a lot of blood.  You have heavy bleeding during your menstrual cycle.  You are pregnant. WHAT IS MY PLAN? Your health care provider may help you to determine how much iron you need per day based on your condition. Generally, when a person consumes sufficient amounts of iron in the diet, the following iron needs are met:  Men.  17-59 years old: 11 mg per day.  8-46 years old: 8 mg per day.  Women.   14-26 years old: 15 mg per day.  71-60 years old: 18 mg per day.  Over 33 years old: 8 mg per day.  Pregnant women: 27 mg per day.  Breastfeeding women: 9 mg per day. WHAT DO I NEED TO KNOW ABOUT AN IRON-RICH DIET?  Eat fresh fruits and vegetables that are high in vitamin C along with foods that are high in iron. This will help increase the amount of iron that your body absorbs from food, especially with foods containing nonheme iron. Foods that are high in vitamin C include oranges, peppers, tomatoes, and mango.  Take iron supplements only as directed by your health care provider. Overdose of iron can be life-threatening. If you were prescribed iron supplements, take them with orange juice or a vitamin C supplement.  Cook foods in pots and pans that are made from iron.   Eat nonheme iron-containing foods alongside foods that are high in heme iron. This helps to improve your iron absorption.   Certain foods and drinks contain compounds that impair iron  absorption. Avoid eating these foods in the same meal as iron-rich foods or with iron supplements. These include:  Coffee, black tea, and red wine.  Milk, dairy products, and foods that are high in calcium.  Beans, soybeans, and peas.  Whole grains.  When eating foods that contain both nonheme iron and compounds that impair iron absorption, follow these tips to absorb iron better.   Soak beans overnight before cooking.  Soak whole grains overnight and drain them before  using.  Ferment flours before baking, such as using yeast in bread dough. WHAT FOODS CAN I EAT? Grains Iron-fortified breakfast cereal. Iron-fortified whole-wheat bread. Enriched rice. Sprouted grains. Vegetables Spinach. Potatoes with skin. Green peas. Broccoli. Red and green bell peppers. Fermented vegetables. Fruits Prunes. Raisins. Oranges. Strawberries. Mango. Grapefruit. Meats and Other Protein Sources Beef liver. Oysters. Beef. Shrimp. [place]. Chicken. [place]. Sardines. Chickpeas. Nuts. Tofu. Beverages Tomato juice. Fresh orange juice. Prune juice. Hibiscus tea. Fortified instant breakfast shakes. Condiments Tahini. Fermented soy sauce. Sweets and Desserts Black-strap molasses.  Other Wheat germ. The items listed above may not be a complete list of recommended foods or beverages. Contact your dietitian for more options. WHAT FOODS ARE NOT RECOMMENDED? Grains Whole grains. Bran cereal. Bran flour. Oats. Vegetables Artichokes. Brussels sprouts. Kale. Fruits Blueberries. Raspberries. Strawberries. Figs. Meats and Other Protein Sources Soybeans. Products made from soy protein. Dairy Milk. Cream. Cheese. Yogurt. Cottage cheese. Beverages Coffee. Black tea. Red wine. Sweets and Desserts Cocoa. Chocolate. Ice cream. Other Basil. Oregano. Parsley. The items listed above may not be a complete list of foods and beverages to avoid. Contact your dietitian for more information.   This information is not intended to replace advice given to you by your health care provider. Make sure you discuss any questions you have with your health care provider.   Document Released: 12/03/2004 Document Revised: 05/12/2014 Document Reviewed: 11/16/2013 Elsevier Interactive Patient Education 2016 [place] hierro (Iron-Rich Diet) El hierro es un mineral que ayuda al organismo a producir hemoglobina. La hemoglobina es una protena de los  glbulos rojos que transporta el oxgeno a los tejidos del cuerpo. Consumir muy poco hierro Motorola se sienta dbil y [place], y aumentar su riesgo de contraer infecciones. Consumir la cantidad suficiente de hierro es necesario para el metabolismo del cuerpo, el funcionamiento muscular y el [place]. El hierro es un componente natural de muchos alimentos. Tambin puede agregarse a los alimentos, o bien los alimentos pueden fortificarse con hierro. Hay dos tipos de hierro de los alimentos:  Hierro hemo. El organismo absorbe el hierro hemo con mayor facilidad que el no hemo. La carne de res, de ave y de pescado contienen hierro hemo.  Hierro no hemo. Se encuentra en los complementos alimenticios, los cereales fortificados con hierro, los frijoles y las verduras. Es posible que deba seguir una dieta con alto contenido de hierro en los siguientes casos:  Si le han diagnosticado una deficiencia de hierro o anemia por deficiencia de hierro.  Si tiene una enfermedad que le impide absorber el hierro de los alimentos, por ejemplo:  Infecciones intestinales.  Enfermedad celaca. Esto incluye la inflamacin permanente (crnica) de los intestinos.  No consume suficiente hierro.  Consume una dieta que incluye muchos alimentos que afectan la absorcin de hierro.  Ha perdido [employer] Group.  Tiene sangrado abundante cuando [place].  Est embarazada. EN QU CONSISTE EL PLAN? El mdico puede ayudarlo a Teacher, adult education la cantidad de hierro que necesita a diario  en funcin de su cuadro clnico. Por lo general, cuando una persona consume cantidades suficientes de hierro en la dieta, se satisfacen las siguientes necesidades de hierro:  Hombres.  De 14 a 18aos: '11mg'$  al da.  De 19 a 50aos: '8mg'$  al da.  Mujeres.   De 14 a 18aos: '15mg'$  al da.  De 19 a 50aos: '18mg'$  al Training and development officer.  Mayores de 50aos: '8mg'$  al da.  Embarazadas: '27mg'$  al da.  Mujeres en perodo de lactancia: '9mg'$   al da. QU DEBO SABER ACERCA DE LA DIETA CON ALTO CONTENIDO DE HIERRO?  Consuma frutas y verduras frescas con alto contenido de vitaminaC junto con alimentos ricos en hierro. Esto ayudar a aumentar la cantidad de hierro que el cuerpo absorbe de los alimentos, en especial, con los que contienen hierro no hemo. Entre los alimentos con alto contenido de vitaminaC se incluyen las [place], los pimientos morrones, los tomates y Education officer, environmental.  Tome los suplementos de hierro solamente como se lo haya indicado el mdico. La sobredosis de hierro puede ser potencialmente mortal. Si le recetan suplementos de hierro, tmelos con jugo de naranja o un suplemento de vitaminaC.  Cocine los alimentos en ollas de hierro.   Consuma alimentos que contengan hierro no hemo junto con aquellos con alto contenido de hierro hemo. Esto ayuda a mejorar la absorcin de hierro.   Determinados alimentos y ciertas bebidas contienen compuestos que afectan la absorcin de hierro. No consuma estos alimentos en la misma comida que aquellos con alto contenido de hierro o con suplementos de Sport and exercise psychologist. Estos incluyen los siguientes:  Caf, t negro y vino tinto.  Leche, productos lcteos y alimentos con alto contenido de calcio.  Frijoles, porotos de soja y [place].  Cereales integrales.  Cuando consuma alimentos que contengan hierro no hemo y compuestos que afecten la absorcin de hierro, siga estos consejos para mejorar la absorcin de hierro.   Antes de cocinarlos, remoje los frijoles durante la noche.  Antes de usarlos, remoje los cereales integrales durante la noche y culelos.  Prepare un fermento con las harinas antes del horneado, como si usara levadura en la masa del pan. QU ALIMENTOS PUEDO COMER? Cereales Cereales para el desayuno fortificados con hierro. Pan de trigo integral fortificado con hierro. Arroz enriquecido. Granos germinados. Verduras [place]. Papas con cscara. Guisantes. Brcoli. Pimientos  morrones rojos y verdes. Verduras fermentadas. Frutas Ciruelas pasas. Pasas. Naranjas. Hughie Closs. Mango. Pomelo. Carnes y otras fuentes de protenas Hgado de res. Ostras. Carne de vaca. Camarones. Pavo. Pollo. Atn. Sardinas. Garbanzos. Nueces. Tofu. Bebidas Jugo de tomate. Jugo de naranja recin exprimido. Jugo de ciruelas. T de hibisco. Batidos instantneos fortificados para el desayuno. Condimentos Tahini. Salsa de soja fermentada. Dulces y Contractor.  Otros Germen de trigo. Los artculos mencionados arriba pueden no ser [employer] Foods Company de las bebidas o los alimentos recomendados. Comunquese con el nutricionista para conocer ms opciones. QU ALIMENTOS NO SE RECOMIENDAN? Cereales Cereales integrales. Cereal de salvado. Harina de salvado. Avena. Verduras [place]. Repollitos de Bruselas. Col rizada. Lambert Mody Arndanos. Oletha Blend. Hughie Closs. Higos. Carnes y otras fuentes de protenas Soja. Productos elaborados a base de protena de la soja. Lcteos Leche. Crema. Queso. Yogur. Time Warner. Bebidas Caf. T negro. Vino tinto. Dulces y [employer] National Corporation. Chocolate. Helados. Otros [place]. Organo. Perejil. Los artculos mencionados arriba pueden no ser Dean Foods Company de las bebidas y los alimentos que se Higher education careers adviser. Comunquese con el nutricionista para obtener ms informacin.   Esta informacin no tiene Psychologist, clinical  consejo del mdico. Asegrese de hacerle al mdico cualquier pregunta que tenga.   Document Released: 02/05/2006 Document Revised: 05/12/2014 Elsevier Interactive Patient Education 2016 Elsevier Inc.  Anemia por deficiencia de hierro - Adultos (Iron Deficiency Anemia, Adult) La anemia es una afeccin en la que el nivel de glbulos rojos o hemoglobina en la sangre est por debajo del normal. La hemoglobina es la sustancia de los glbulos rojos que transporta el oxgeno. La anemia por deficiencia de hierro es la anemia  provocada por un nivel bajo de hierro. Este es el tipo ms comn de anemia y puede provocarle cansancio y dificultad para Ambulance person. CAUSAS   Falta de hierro en la dieta.  Absorcin deficiente de hierro, como sucede con los trastornos intestinales.  Hemorragia intestinal.  Perodos abundantes. SIGNOS Y SNTOMAS  Cuando la anemia es leve puede pasar inadvertida. Los sntomas pueden ser:  Park Liter.  Dolor de [place].  Piel plida.  Debilidad.  Cansancio.  Falta de aire.  Mareos.  Manos y pies fros.  Frecuencia cardaca acelerada o irregular. DIAGNSTICO  El diagnstico requiere una evaluacin y un examen fsico minuciosos por parte del mdico. [place], se realizan anlisis de sangre para confirmar la anemia por deficiencia de hierro. Es posible que se realicen anlisis adicionales para encontrar la causa subyacente de la anemia. Estos pueden ser:  Anlisis para Hydrographic surveyor la presencia de Continental Airlines heces (anlisis de sangre oculta en heces).  Un procedimiento para examinar el interior del colon y el recto (colonoscopia).  Un procedimiento para examinar el interior del esfago y Product manager (endoscopia). TRATAMIENTO  La anemia por deficiencia de hierro se trata al remediar la causa de la deficiencia. El tratamiento incluye:  Agregarle a la dieta alimentos ricos en hierro.  Tomar suplementos de hierro. Las mujeres embarazadas o que estn amamantando necesitan una dosis adicional de hierro, ya que su dieta normal generalmente no proporciona la cantidad necesaria.  Tomar vitaminas. La vitamina C mejora la absorcin del hierro. Es posible que el mdico le recomiende tomar los comprimidos de hierro con un vaso de jugo de naranja o con un suplemento de vitamina C.  Medicamentos para disminuir el flujo menstrual abundante.  Ciruga. [place]hn el hierro segn las indicaciones del mdico.  Si no puede tragar los suplementos de  hierro, hable con su mdico sobre la posibilidad de que se lo coloquen en la vena (va intravenosa) o mediante una inyeccin en el msculo.  Para lograr una mejor absorcin del hierro, los suplementos se deben tomar con el estmago vaco. Si no los tolera con el estmago vaco, posiblemente deba tomarlos con la comida.  No beba leche ni tome anticidos al [employer] suplementos de hierro, ya que estos pueden interferir en la absorcin del hierro.  Los suplementos de hierro pueden Recruitment consultant. Para prevenirlo, asegrese de incluir fibras en su dieta. Posiblemente tambin se recomiende el uso de un ablandador de heces.  Tome las vitaminas segn las indicaciones del mdico.  Siga una dieta rica en hierro. Los alimentos con alto contenido de hierro incluyen hgado, carne magra, pan de grano integral, [place], frutas deshidratadas y vegetales de hojas color verde oscuro. SOLICITE ATENCIN MDICA DE INMEDIATO SI:   Se desmaya. Si esto sucede, no conduzca. Comunquese con el servicio de emergencias de su localidad (911 en los [place]) si no dispone de [place].  Siente dolor en el pecho.  Se siente nauseoso o vomita.  Presenta una dificultad respiratoria grave o en aumento al Southern Company.  Se siente dbil.  Tiene una frecuencia cardaca acelerada.  Comienza a sudar sin motivo.  Se siente mareado cuando se levanta de una silla o de la cama. ASEGRESE DE QUE:   Comprende estas instrucciones.  Controlar su afeccin.  Recibir ayuda de inmediato si no mejora o si empeora.   Esta informacin no tiene Marine scientist el consejo del mdico. Asegrese de hacerle al mdico cualquier pregunta que tenga.   Document Released: 04/21/2005 Document Revised: 05/12/2014 Elsevier Interactive Patient Education 2016 [place] (Health Maintenance, Female) Un estilo de vida saludable y los cuidados preventivos pueden  favorecer considerablemente a la salud y Musician. Pregunte a su mdico cul es el cronograma de exmenes peridicos apropiado para usted. Esta es una buena oportunidad para consultarlo sobre cmo prevenir enfermedades y [place] sano. Adems de los controles, hay muchas otras cosas que puede hacer usted mismo. Los expertos han realizado numerosas investigaciones [employer]vin[employer]itor cambios en el estilo de vida y las medidas de prevencin que, [place], lo ayudarn a mantenerse sano. Solicite a su mdico ms informacin. EL PESO Y LA DIETA  Consuma una dieta saludable.  Asegrese de [employer] verduras, frutas, productos lcteos de bajo contenido de [place] y Advertising account planner.  No consuma muchos alimentos de alto contenido de grasas slidas, azcares agregados o sal.  Realice actividad fsica con regularidad. Esta es una de las prcticas ms importantes que puede hacer por su salud.  La mayora de los adultos deben hacer ejercicio durante al menos 157mnutos por semana. El ejercicio debe aumentar la frecuencia cardaca y pActorla transpiracin (ejercicio de iColumbus AFB.  La mayora de los adultos tambin deben hField seismologistejercicios de elongacin al mToysRusveces a la semana. Agregue esto al su plan de ejercicio de intensidad moderada. Mantenga un peso saludable.  El ndice de masa corporal ([facility] es una medida que puede utilizarse para identificar posibles problemas de pWaldron Proporciona una estimacin de la grasa corporal basndose en el peso y la altura. Su mdico puede ayudarle a dRadiation protection practitionerIEdwardsporty a lScientist, forensico mTheatre managerun peso saludable.  Para las mujeres de 20aos o ms:  Un ISaint Marys Hospitalmenor de 18,5 se considera bajo peso.  Un IEye Surgery Center Of Arizonaentre 18,5 y 24,9 es normal.  Un IMcalester Ambulatory Surgery Center LLCentre 25 y 29,9 se considera sobrepeso.  Un IMC de 30 o ms se considera obesidad. Observe los niveles de colesterol y lpidos en la sangre.  Debe comenzar a rEnglish as a second language teacherde lpidos y cResearch officer, trade unionen la  sangre a los 20aos y luego repetirlos cada 576aos  Es posible que nAutomotive engineerlos niveles de colesterol con mayor frecuencia si:  Sus niveles de lpidos y colesterol son altos.  Es mayor de 50aos.  Presenta un alto riesgo de padecer enfermedades cardacas. DETECCIN DE CNCER  Cncer de pulmn  Se recomienda realizar exmenes de deteccin de cncer de pulmn a personas adultas entre 531y 869aos que estn en riesgo de dHorticulturist, commercialde pulmn por sus antecedentes de consumo de tabaco.  Se recomienda una tomografa computarizada de baja dosis de los pLiberty Mediaaos a las personas que:  Fuman actualmente.  Hayan dejado el hbito en algn momento en los ltimos 15aos.  Hayan fumado durante 30aos un paquete diario. Un paquete-ao equivale a fumar un promedio de un paquete de cigarrillos diario durante un ao.  Los exmenes de deteccin  anuales deben continuar hasta que hayan pasado 15aos desde que dej de fumar.  Ya no debern realizarse si tiene un problema de salud que le impida recibir tratamiento para Science writer de pulmn. Cncer de mama  Practique la autoconciencia de la mama. Esto significa reconocer la apariencia normal de sus mamas y cmo las siente.  Tambin significa realizar autoexmenes regulares de Johnson [employer]. Informe a su mdico sobre cualquier cambio, sin importar cun pequeo sea.  Si tiene entre 20 y 69 aos, un mdico debe realizarle un examen clnico de las mamas como parte del examen regular de [place], cada 1 a 3aos.  Si tiene 40aos o ms, debe Information systems manager clnico de las Microsoft. Tambin considere realizarse una [place] ([place]) todos los [place].  Si tiene antecedentes familiares de cncer de mama, hable con su mdico para someterse a un estudio gentico.  Si tiene alto riesgo de Chief Financial Officer de mama, hable con su mdico para someterse a Public house manager y [employer].  La evaluacin del gen del cncer de mama (BRCA) se recomienda a mujeres que tengan familiares con cnceres relacionados con el BRCA. Los cnceres relacionados con el BRCA incluyen los siguientes:  Mama.  Ovario.  Trompas.  Cnceres de peritoneo.  Los resultados de la evaluacin determinarn la necesidad de asesoramiento gentico y de [place] de BRCA1 y BRCA2. Cncer de cuello del tero El mdico puede recomendarle que se haga pruebas peridicas de deteccin de cncer de los rganos de la pelvis (ovarios, tero y vagina). Estas pruebas incluyen un examen plvico, que abarca controlar si se produjeron cambios microscpicos en la superficie del cuello del tero (prueba de Papanicolaou). Pueden recomendarle que se haga estas pruebas cada 3aos, a partir de los 21aos.  A las mujeres que tienen entre 30 y 50aos, los mdicos pueden recomendarles que se sometan a exmenes plvicos y pruebas de Papanicolaou cada 29aos, o a la prueba de Papanicolaou y el examen plvico en combinacin con estudios de deteccin del virus del papiloma humano (VPH) cada 5aos. Algunos tipos de VPH aumentan el riesgo de Chief Financial Officer de cuello del tero. La prueba para la deteccin del VPH tambin puede realizarse a mujeres de cualquier edad cuyos resultados de la prueba de Papanicolaou no sean claros.  Es posible que otros mdicos no recomienden exmenes de deteccin a mujeres no embarazadas que se consideran sujetos de bajo riesgo de Chief Financial Officer de pelvis y que no tienen sntomas. Pregntele al mdico si un examen plvico de deteccin es adecuado para usted.  Si ha recibido un tratamiento para Science writer cervical o una enfermedad que podra causar cncer, necesitar realizarse una prueba de Papanicolaou y controles durante al menos 90 aos de concluido el [place]. Si no se ha hecho el Papanicolaou con regularidad, debern volver a evaluarse los factores de riesgo (como tener un nuevo compaero sexual),  para Teacher, adult education si debe realizarse los estudios nuevamente. Algunas mujeres sufren problemas mdicos que aumentan la probabilidad de Museum/gallery curator cncer de cuello del tero. En estos casos, el mdico podr [employer]AL[employer] se realicen controles y pruebas de Papanicolaou con ms frecuencia. Cncer colorrectal  Este tipo de cncer puede detectarse y a menudo prevenirse.  Por lo general, los estudios de rutina se deben Medical laboratory scientific officer a Field seismologist a Proofreader de los 85 aos y [place] 46 aos.  Sin embargo, el mdico podr aconsejarle que lo haga antes, si tiene factores de riesgo para Science writer de  colon.  Tambin puede recomendarle que use un kit de prueba para Scientist, product/process development en la materia fecal.  Es posible que se use una pequea cmara en el extremo de un tubo para examinar directamente el colon (sigmoidoscopia o colonoscopia) a fin de Engineer, manufacturing formas tempranas de cncer colorrectal.  Los exmenes de rutina generalmente comienzan a los 50aos.  El examen directo del colon se debe repetir cada 5 a 10aos hasta los 75aos. Sin embargo, es posible que se realicen exmenes con mayor frecuencia, si se detectan formas tempranas de plipos precancerosos o pequeos bultos. Cncer de piel  Revise la piel de la cabeza a los pies con regularidad.  Informe a su mdico si aparecen nuevos lunares o los que tiene se modifican, especialmente en su forma y color.  Tambin notifique al mdico si tiene un lunar que es ms grande que el tamao de una goma de lpiz.  Siempre use pantalla solar. Aplique pantalla solar de Pietro Cassis y repetida a lo largo del Futures trader.  Protjase usando mangas y [employer], un sombrero de ala ancha y gafas para el sol, siempre que se encuentre en el exterior. ENFERMEDADES CARDACAS, DIABETES E HIPERTENSIN ARTERIAL   La hipertensin arterial causa enfermedades cardacas y [place] el riesgo de ictus. La hipertensin arterial es ms probable en los siguientes casos:  Las personas que  tienen la presin arterial en el extremo del rango normal (100-139/85-89 mm Hg).  Las personas con sobrepeso u obesidad.  Las Statistician.  Si usted tiene entre 18 y 39 aos, debe medirse la presin arterial cada 3 a 5 aos. Si usted tiene 40 aos o ms, debe medirse la presin arterial [employer]. Debe medirse la presin arterial dos veces: una vez cuando est en un hospital o una clnica y la otra vez cuando est en otro sitio. Registre el promedio de Johnson [employer]. Para controlar su presin arterial cuando no est en un hospital o Paulita Cradle, puede usar lo siguiente:  Neomia Dear mquina automtica para medir la presin arterial en una farmacia.  Un monitor para medir la presin arterial en el hogar.  Si tiene entre 55 y 79 aos, consulte a su mdico si debe tomar aspirina para prevenir el ictus.  Realcese exmenes de deteccin de la diabetes con regularidad. Esto incluye la toma de [place] de sangre para controlar el nivel de azcar en la sangre durante el [place].  Si tiene un peso normal y un bajo riesgo de padecer diabetes, realcese este anlisis cada tres aos despus de los 44aos.  Si tiene sobrepeso y un alto riesgo de padecer diabetes, considere someterse a este anlisis antes o con mayor frecuencia. PREVENCIN DE INFECCIONES  HepatitisB  Si tiene un riesgo ms alto de Primary school teacher hepatitis B, debe someterse a un examen de deteccin de este virus. Se considera que tiene un alto riesgo de Primary school teacher hepatitis B si:  Naci en un pas donde la hepatitis B es frecuente. Pregntele a su mdico qu pases son considerados de Conservator, museum/gallery.  Sus padres nacieron en un pas de alto riesgo y usted no recibi una vacuna que lo proteja contra la hepatitis B (vacuna contra la hepatitis B).  Tiene VIH o sida.  [place] agujas para inyectarse drogas.  Vive con alguien que tiene hepatitis B.  Ha tenido sexo con alguien que tiene hepatitis B.  Recibe tratamiento de  hemodilisis.  Toma ciertos medicamentos para el cncer, trasplante de rganos y afecciones autoinmunitarias. Hepatitis C  Se  recomienda un anlisis de [place] para:  Todos los que nacieron entre 1945 y [phone].  Todas las personas que tengan un riesgo de haber contrado hepatitis C. Enfermedades de transmisin sexual (ETS).  Debe realizarse pruebas de deteccin de enfermedades de transmisin sexual (ETS), incluidas gonorrea y clamidia si:  Es sexualmente activo y es menor de 23aos.  Es mayor de 23aos, y Investment banker, operational informa que corre riesgo de tener este tipo de infecciones.  La actividad sexual ha cambiado desde que le hicieron la ltima prueba de deteccin y tiene un riesgo mayor de Best boy clamidia o Radio broadcast assistant. Pregntele al mdico si usted tiene riesgo.  Si no tiene el VIH, pero corre riesgo de infectarse por el virus, se recomienda tomar diariamente un medicamento recetado para evitar la infeccin. Esto se conoce como profilaxis previa a la exposicin. Se considera que est en riesgo si:  Es [place] sexualmente y no [place] preservativos habitualmente o no conoce el estado del VIH de sus Advertising copywriter.  Se inyecta drogas.  Es [place] sexualmente con Ardelia Mems pareja que tiene VIH. Consulte a su mdico para saber si tiene un alto riesgo de infectarse por el VIH. Si opta por comenzar la profilaxis previa a la exposicin, primero debe realizarse anlisis de deteccin del VIH. Luego, le harn anlisis cada 71mses mientras est tomando los medicamentos para la profilaxis previa a la exposicin.  [facility]  Si es premenopusica y puede quedar eSouth [place] solicite a su mdico asesoramiento previo a la concepcin.  Si puede quedar embarazada, tome 400 a [id number](mcg) de cido fAnheuser-Busch  Si desea evitar el embarazo, hable con su mdico sobre el control de la natalidad (anticoncepcin). OSTEOPOROSIS Y MENOPAUSIA   La osteoporosis es una enfermedad en la que los huesos  pierden los minerales y la fuerza por el avance de la edad. El resultado pueden ser fracturas graves en los hSherando El riesgo de osteoporosis puede identificarse con uArdelia Memsprueba de densidad sea.  Si tiene 64aos o ms, o si est en riesgo de sufrir osteoporosis y fracturas, pregunte a su mdico si debe someterse a exmenes.  Consulte a su mdico si debe tomar un suplemento de calcio o de vitamina D para reducir el riesgo de osteoporosis.  La menopausia puede presentar ciertos sntomas fsicos y rGaffer  La terapia de reemplazo hormonal puede reducir algunos de estos sntomas y rGaffer Consulte a su mdico para saber si la terapia de reemplazo hormonal es conveniente para usted.  INSTRUCCIONES PARA EL CUIDADO EN EL HOGAR   Realcese los estudios de rutina de la salud, dentales y de lPublic librarian  [place]  No consuma ningn producto que contenga tabaco, lo que incluye cigarrillos, tabaco de mHigher education careers advisero cPsychologist, sport and exercise  Si est embarazada, no beba alcohol.  Si est amamantando, reduzca el consumo de alcohol y la frecuencia con la que consume.  Si es mujer y no est embarazada limite el consumo de alcohol a no ms de 1 medida por da. Una medida equivale a 12onzas de cerveza, 5onzas de vino o 1onzas de bebidas alcohlicas de alta graduacin.  No consuma drogas.  No comparta agujas.  Solicite ayuda a su mdico si necesita apoyo o informacin para abandonar las drogas.  Informe a su mdico si a menudo se siente deprimido.  Notifique a su mdico si alguna vez ha sido vctima de abuso o si no se siente seguro en su hogar.   Esta informacin no tiene como fin  reemplazar el consejo del mdico. Asegrese de hacerle al mdico cualquier pregunta que tenga.   Document Released: 04/10/2011 Document Revised: 05/12/2014 Elsevier Interactive Patient Education [employer].

## 2016-01-22 LAB — TSH: TSH: 2.54 mIU/L

## 2016-01-22 LAB — COMPREHENSIVE METABOLIC PANEL
ALT: 12 U/L (ref 6–29)
AST: 16 U/L (ref 10–30)
Albumin: 4.5 g/dL (ref 3.6–5.1)
Alkaline Phosphatase: 46 U/L (ref 33–115)
BILIRUBIN TOTAL: 0.4 mg/dL (ref 0.2–1.2)
BUN: 12 mg/dL (ref 7–25)
CHLORIDE: 102 mmol/L (ref 98–110)
CO2: 23 mmol/L (ref 20–31)
CREATININE: 0.73 mg/dL (ref 0.50–1.10)
Calcium: 9.2 mg/dL (ref 8.6–10.2)
GLUCOSE: 84 mg/dL (ref 65–99)
Potassium: 3.9 mmol/L (ref 3.5–5.3)
SODIUM: 135 mmol/L (ref 135–146)
Total Protein: 7.3 g/dL (ref 6.1–8.1)

## 2016-01-22 LAB — CBC
HCT: 34.8 % — ABNORMAL LOW (ref 35.0–45.0)
HEMOGLOBIN: 11.7 g/dL (ref 11.7–15.5)
MCH: 27 pg (ref 27.0–33.0)
MCHC: 33.6 g/dL (ref 32.0–36.0)
MCV: 80.4 fL (ref 80.0–100.0)
MPV: 9.9 fL (ref 7.5–12.5)
PLATELETS: 347 10*3/uL (ref 140–400)
RBC: 4.33 MIL/uL (ref 3.80–5.10)
RDW: 14.4 % (ref 11.0–15.0)
WBC: 8.7 10*3/uL (ref 3.8–10.8)

## 2016-01-22 LAB — FERRITIN: Ferritin: 11 ng/mL (ref 10–232)

## 2016-01-23 LAB — PAP IG AND HPV HIGH-RISK: HPV DNA HIGH RISK: NOT DETECTED

## 2016-01-23 LAB — VITAMIN D 25 HYDROXY (VIT D DEFICIENCY, FRACTURES): Vit D, 25-Hydroxy: 19 ng/mL — ABNORMAL LOW (ref 30–100)

## 2016-01-28 ENCOUNTER — Encounter: Payer: Self-pay | Admitting: Family Medicine

## 2016-01-28 MED ORDER — VITAMIN D (ERGOCALCIFEROL) 1.25 MG (50000 UNIT) PO CAPS: 50000.0000 [IU] | ORAL_CAPSULE | ORAL | 0 refills | Status: DC

## 2016-01-28 MED ORDER — FERROUS SULFATE 325 (65 FE) MG PO TABS: 325.0000 mg | ORAL_TABLET | Freq: Every day | ORAL | 0 refills | Status: DC

## 2016-03-14 ENCOUNTER — Other Ambulatory Visit: Payer: Self-pay | Admitting: Internal Medicine

## 2016-03-14 ENCOUNTER — Other Ambulatory Visit: Payer: Self-pay | Admitting: Family Medicine

## 2016-03-14 DIAGNOSIS — Z1231 Encounter for screening mammogram for malignant neoplasm of breast: Secondary | ICD-10-CM

## 2016-04-09 ENCOUNTER — Ambulatory Visit
Admission: RE | Admit: 2016-04-09 | Discharge: 2016-04-09 | Disposition: A | Discharge status: Home / Self Care | Payer: BLUE CROSS/BLUE SHIELD | Source: Ambulatory Visit | Attending: Family Medicine | Admitting: Family Medicine

## 2016-04-09 DIAGNOSIS — Z1231 Encounter for screening mammogram for malignant neoplasm of breast: Secondary | ICD-10-CM

## 2016-04-11 ENCOUNTER — Other Ambulatory Visit: Payer: Self-pay | Admitting: Family Medicine

## 2016-04-11 ENCOUNTER — Telehealth: Payer: Self-pay

## 2016-04-11 DIAGNOSIS — R928 Other abnormal and inconclusive findings on diagnostic imaging of breast: Secondary | ICD-10-CM

## 2016-04-11 NOTE — Telephone Encounter (Signed)
Received from [place] for diagnostic study.  [id number] Paper in Dr Manya Silvas Box

## 2016-04-21 ENCOUNTER — Ambulatory Visit
Admission: RE | Admit: 2016-04-21 | Discharge: 2016-04-21 | Disposition: A | Discharge status: Home / Self Care | Payer: BLUE CROSS/BLUE SHIELD | Source: Ambulatory Visit | Attending: Family Medicine | Admitting: Family Medicine

## 2016-04-21 DIAGNOSIS — R928 Other abnormal and inconclusive findings on diagnostic imaging of breast: Secondary | ICD-10-CM

## 2016-04-30 ENCOUNTER — Ambulatory Visit (INDEPENDENT_AMBULATORY_CARE_PROVIDER_SITE_OTHER): Payer: BLUE CROSS/BLUE SHIELD | Admitting: Family Medicine

## 2016-04-30 VITALS — BP 110/66 | HR 69 | Temp 98.0°F | Resp 16 | Ht 60.0 in | Wt 119.0 lb

## 2016-04-30 DIAGNOSIS — J321 Chronic frontal sinusitis: Secondary | ICD-10-CM

## 2016-04-30 DIAGNOSIS — H65192 Other acute nonsuppurative otitis media, left ear: Secondary | ICD-10-CM

## 2016-04-30 MED ORDER — DOXYCYCLINE HYCLATE 100 MG PO CAPS: 100.0000 mg | ORAL_CAPSULE | Freq: Two times a day (BID) | ORAL | 0 refills | Status: DC

## 2016-04-30 MED ORDER — IPRATROPIUM BROMIDE 0.03 % NA SOLN: 2.0000 | Freq: Two times a day (BID) | NASAL | 0 refills | Status: DC

## 2016-04-30 NOTE — Progress Notes (Signed)
Patient ID: Lisa Villarreal, female    DOB: January 21, 1976, 40 y.o.   MRN: [id number]  PCP: Delman Cheadle, MD  Chief Complaint  Patient presents with  . Cough  . Cerumen Impaction    LEFT    Subjective:  HPI [name], 40 year old presents for evaluation of cough and cerumen impaction  Left ear "sensation of fullness" with decreased hearing. Coughing 3 weeks.  Sometimes productive of greenish and yellowish sputum. Bilateral headache upper frontal region. Subjective fever two weeks ago. Scratchy throat no longer sore. Taken multiple over the counter medicines with minimal relief.   Social History   Social History  . Marital status: Married    Spouse name: N/A  . Number of children: N/A  . Years of education: N/A   Occupational History  . Not on file.   Social History Main Topics  . Smoking status: Never Smoker  . Smokeless tobacco: Never Used  . Alcohol use No  . Drug use: No  . Sexual activity: Yes    Birth control/ protection: Condom   Other Topics Concern  . Not on file   Social History Narrative   Financial assistance approved for 100% discount at [facility] and has [facility] card.    Family History  Problem Relation Age of Onset  . Leukemia Maternal Aunt    Review of Systems   See HPI  Patient Active Problem List   Diagnosis Date Noted  . Upper back pain 08/16/2014  . 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 Hypopigmentation of bilateral forearms 11/17/2011  . Health maintenance examination 10/24/2010  . GENERALIZED ANXIETY DISORDER 08/28/2008    Allergies  Allergen Reactions  . Penicillins     REACTION: Swelling    Prior to Admission medications   Medication Sig Start Date End Date Taking? Authorizing Provider  ferrous sulfate 325 (65 FE) MG tablet Take 1 tablet (325 mg total) by mouth daily with breakfast. 01/28/16  Yes Shawnee Knapp, MD  Vitamin D, Ergocalciferol, (DRISDOL) 50000 units CAPS capsule Take 1 capsule (50,000 Units total) by mouth every 7 (seven) days.  01/28/16  Yes Shawnee Knapp, MD    Past Medical, Surgical Family and Social History reviewed and updated.    Objective:   Today's Vitals   04/30/16 1101  BP: 110/66  Pulse: 69  Resp: 16  Temp: 98 F (36.7 C)  TempSrc: Oral  SpO2: 99%  Weight: 119 lb (54 kg)  Height: 5' (1.524 m)   Wt Readings from Last 3 Encounters:  04/30/16 119 lb (54 kg)  01/21/16 119 lb (54 kg)  01/26/15 115 lb 11.2 oz (52.5 kg)   Physical Exam  Constitutional: She is oriented to person, place, and time. She appears well-developed and well-nourished.  HENT:  Right Ear: A middle ear effusion is present.  Left Ear: A middle ear effusion is present.  Nose: Mucosal edema and rhinorrhea present.  Mouth/Throat: Posterior oropharyngeal erythema present. No oropharyngeal exudate or posterior oropharyngeal edema.  Cardiovascular: Normal rate, normal heart sounds and intact distal pulses.   Pulmonary/Chest: Effort normal and breath sounds normal.  Neurological: She is alert and oriented to person, place, and time.  Skin: Skin is warm and dry.  Psychiatric: She has a normal mood and affect. Her behavior is normal. Judgment and thought content normal.     Assessment & Plan:  1. Frontal sinusitis, unspecified chronicity 2. Acute MEE (middle ear effusion), left Continue Zyrtec 10 mg once daily.  Ipratropium (Atrovent) 2 sprays, twice  daily until congestion improves.  Doxycycline 100 mg twice daily for 10 days.  If ear fullness with diminished hearing persists greater than 3 weeks return for follow-up.   Carroll Sage. Kenton Kingfisher, MSN, FNP-C [facility]

## 2016-04-30 NOTE — Patient Instructions (Addendum)
Continue Zyrtec 10 mg once daily.  Ipratropium (Atrovent) 2 sprays, twice daily until congestion improves.  Doxycycline 100 mg twice daily for 10 days.  If ear fullness with diminished hearing persists greater than 3 weeks return for follow-up.  IF you received an x-ray today, you will receive an invoice from [facility]. Please contact [facility] at [phone] with questions or concerns regarding your invoice.   IF you received labwork today, you will receive an invoice from [place]. Please contact [facility] at [phone] with questions or concerns regarding your invoice.   Our billing staff will not be able to assist you with questions regarding bills from these companies.  You will be contacted with the lab results as soon as they are available. The fastest way to get your results is to activate your My Chart account. Instructions are located on the last page of this paperwork. If you have not heard from [place] regarding the results in 2 weeks, please contact this office.

## 2017-01-09 ENCOUNTER — Encounter: Payer: Self-pay | Admitting: Physician Assistant

## 2017-01-09 ENCOUNTER — Ambulatory Visit (INDEPENDENT_AMBULATORY_CARE_PROVIDER_SITE_OTHER): Payer: BLUE CROSS/BLUE SHIELD | Admitting: Physician Assistant

## 2017-01-09 VITALS — BP 109/74 | HR 70 | Temp 98.1°F | Resp 16 | Ht 60.0 in | Wt 120.0 lb

## 2017-01-09 DIAGNOSIS — Z23 Encounter for immunization: Secondary | ICD-10-CM | POA: Diagnosis not present

## 2017-01-09 NOTE — Progress Notes (Signed)
Patient was not seen here today--for flu shot. Physical exam too early.  States she will return after 01/20/2017.

## 2017-01-09 NOTE — Patient Instructions (Signed)
     IF you received an x-ray today, you will receive an invoice from [facility]. Please contact [facility] at [phone] with questions or concerns regarding your invoice.   IF you received labwork today, you will receive an invoice from [facility]. Please contact [facility] at [phone] with questions or concerns regarding your invoice.   Our billing staff will not be able to assist you with questions regarding bills from these companies.  You will be contacted with the lab results as soon as they are available. The fastest way to get your results is to activate your My Chart account. Instructions are located on the last page of this paperwork. If you have not heard from us regarding the results in 2 weeks, please contact this office.     

## 2017-03-12 ENCOUNTER — Other Ambulatory Visit: Payer: Self-pay | Admitting: Family Medicine

## 2017-03-12 DIAGNOSIS — Z1231 Encounter for screening mammogram for malignant neoplasm of breast: Secondary | ICD-10-CM

## 2017-03-19 ENCOUNTER — Encounter: Payer: Self-pay | Admitting: Family Medicine

## 2017-03-19 ENCOUNTER — Ambulatory Visit (INDEPENDENT_AMBULATORY_CARE_PROVIDER_SITE_OTHER): Payer: BLUE CROSS/BLUE SHIELD | Admitting: Family Medicine

## 2017-03-19 ENCOUNTER — Other Ambulatory Visit: Payer: Self-pay

## 2017-03-19 VITALS — BP 104/60 | HR 91 | Temp 98.5°F | Resp 18 | Ht 60.0 in | Wt 121.4 lb

## 2017-03-19 DIAGNOSIS — Z30011 Encounter for initial prescription of contraceptive pills: Secondary | ICD-10-CM | POA: Diagnosis not present

## 2017-03-19 DIAGNOSIS — D509 Iron deficiency anemia, unspecified: Secondary | ICD-10-CM | POA: Diagnosis not present

## 2017-03-19 DIAGNOSIS — N6001 Solitary cyst of right breast: Secondary | ICD-10-CM

## 2017-03-19 DIAGNOSIS — Z0000 Encounter for general adult medical examination without abnormal findings: Secondary | ICD-10-CM

## 2017-03-19 DIAGNOSIS — E559 Vitamin D deficiency, unspecified: Secondary | ICD-10-CM

## 2017-03-19 LAB — POCT URINE PREGNANCY: Preg Test, Ur: NEGATIVE

## 2017-03-19 MED ORDER — NORGESTIMATE-ETH ESTRADIOL 0.25-35 MG-MCG PO TABS: 1.0000 | ORAL_TABLET | Freq: Every day | ORAL | 11 refills | Status: DC

## 2017-03-19 NOTE — Progress Notes (Signed)
11/15/20189:15 AM  Lisa Villarreal 04/20/1976, 41 y.o. female [id number]  Chief Complaint  Patient presents with  . Annual Exam    HPI:   Patient is a 41 y.o. female who presents today for her annual exam.  [id number] C/s x1, vbac x 2 LMP 02/27/17 Menses are regular Uses condoms, would really like tubal ligation but cant afford, would like to start OCPs as she really does not desire any more pregnancies Patient reports remote migraines, at times when anxiety was high, no auras, does not smoke, no personal or familial h/o VTE Last pap 01/2016, neg pap and HPV Last mammo 2017, birads 2, simple cyst in right breast and dense tissue in left, scheduled for annual mammo 04/2017 Denies any family history of breast, ovarian or colon cancer History of iron def anemia and vit d deficiency Treated last year with 6 months of oral supplementation Reports a healthy diet, with lots of fruits and veggies Runs 20 minutes 5 x week Has no acute concerns today  Depression screen [facility] 2/9 03/19/2017 01/09/2017 04/30/2016  Decreased Interest 0 0 0  Down, Depressed, Hopeless 0 0 0  PHQ - 2 Score 0 0 0    Allergies  Allergen Reactions  . Penicillins     REACTION: Swelling    Prior to Admission medications   Medication Sig Start Date End Date Taking? Authorizing Provider    Past Medical History:  Diagnosis Date  . Anemia   . Anxiety   . Headache, migraine    resolved, none for years  . Vitamin D deficiency     Past Surgical History:  Procedure Laterality Date  . CESAREAN SECTION      Social History   Tobacco Use  . Smoking status: Never Smoker  . Smokeless tobacco: Never Used  Substance Use Topics  . Alcohol use: No    Family History  Problem Relation Age of Onset  . Leukemia Maternal Aunt     Review of Systems  Constitutional: Negative for chills and fever.  HENT: Negative for congestion, ear pain and sore throat.   Eyes: Negative for blurred vision and double  vision.  Respiratory: Negative for cough and shortness of breath.   Cardiovascular: Negative for chest pain, palpitations and leg swelling.  Gastrointestinal: Negative for abdominal pain, constipation, diarrhea, nausea and vomiting.  Genitourinary: Negative for dysuria and hematuria.       Neg new breast lumps or nipple discharge, right cyst unchanged Neg vaginal discharge, pelvic pain, dyspareunia, abnormal vaginal bleeding  Musculoskeletal: Negative for myalgias.  Neurological: Negative for dizziness and headaches.  Endo/Heme/Allergies: Negative for polydipsia.  Psychiatric/Behavioral: Negative for depression. The patient is nervous/anxious.      OBJECTIVE:  Blood pressure 104/60, pulse 91, temperature 98.5 F (36.9 C), temperature source Oral, resp. rate 18, height 5' (1.524 m), weight 121 lb 6.4 oz (55.1 kg), last menstrual period 03/06/2017, SpO2 99 %.  Physical Exam  Constitutional: She is oriented to person, place, and time and well-developed, well-nourished, and in no distress.  HENT:  Head: Normocephalic and atraumatic.  Right Ear: Hearing, tympanic membrane, external ear and ear canal normal.  Left Ear: Hearing, tympanic membrane, external ear and ear canal normal.  Mouth/Throat: Oropharynx is clear and moist.  Eyes: EOM are normal. Pupils are equal, round, and reactive to light.  Neck: Neck supple. No thyromegaly present.  Cardiovascular: Normal rate, regular rhythm, normal heart sounds and intact distal pulses. Exam reveals no gallop and no friction rub.  No murmur  heard. Pulmonary/Chest: Effort normal and breath sounds normal. She has no wheezes. She has no rales. Right breast exhibits no inverted nipple, no nipple discharge, no skin change and no tenderness. Left breast exhibits no inverted nipple, no mass, no nipple discharge, no skin change and no tenderness.    Abdominal: Soft. Bowel sounds are normal. She exhibits no distension and no mass. There is no tenderness.    Musculoskeletal: Normal range of motion. She exhibits no edema.  Lymphadenopathy:    She has no cervical adenopathy.    She has no axillary adenopathy.       Right: No supraclavicular adenopathy present.       Left: No supraclavicular adenopathy present.  Neurological: She is alert and oriented to person, place, and time. She has normal reflexes. Gait normal.  Skin: Skin is warm and dry.  Psychiatric: Mood and affect normal.  Nursing note and vitals reviewed.   Results for orders placed or performed in visit on 03/19/17 (from the past 24 hour(s))  POCT urine pregnancy     Status: None   Collection Time: 03/19/17  9:12 AM  Result Value Ref Range   Preg Test, Ur Negative Negative    No results found.   ASSESSMENT and PLAN 1. Annual physical exam No concerns per history or exam. Routine HCM labs ordered. HCM reviewed/discussed. Anticipatory guidance regarding healthy weight, lifestyle and choices given.   - Comprehensive metabolic panel  2. Encounter for initial prescription of contraceptive pills BC options discussed, patient not interested in LARCs at this time. OCPs r/se/b reviewed. Patient educational handout given. - POCT urine pregnancy - norgestimate-ethinyl estradiol (ORTHO-CYCLEN,SPRINTEC,PREVIFEM) 0.25-35 MG-MCG tablet; Take 1 tablet daily by mouth.   3. Vitamin D deficiency Checking level today, discussed OTC supplementation of 1000u a day - Vitamin D, 25-hydroxy  4. Iron deficiency anemia, unspecified iron deficiency anemia type Discussed iron rich foods - CBC with Differential - Ferritin  5. Simple cyst of right breast Benign. Patient scheduled for routine breast cancer surveillance mammogram.    Return in about 4 weeks (around 04/16/2017) for after mammo, fu OCPs.    Rutherford Guys, MD Primary Care at [place] [place], [place] [address] Ph.  [phone] Fax [phone]

## 2017-03-19 NOTE — Patient Instructions (Signed)
     IF you received an x-ray today, you will receive an invoice from [facility]. Please contact [facility] at [phone] with questions or concerns regarding your invoice.   IF you received labwork today, you will receive an invoice from [facility]. Please contact [facility] at [phone] with questions or concerns regarding your invoice.   Our billing staff will not be able to assist you with questions regarding bills from these companies.  You will be contacted with the lab results as soon as they are available. The fastest way to get your results is to activate your My Chart account. Instructions are located on the last page of this paperwork. If you have not heard from us regarding the results in 2 weeks, please contact this office.     

## 2017-03-20 LAB — COMPREHENSIVE METABOLIC PANEL
ALT: 15 IU/L (ref 0–32)
AST: 16 IU/L (ref 0–40)
Albumin/Globulin Ratio: 1.8 (ref 1.2–2.2)
Albumin: 4.4 g/dL (ref 3.5–5.5)
Alkaline Phosphatase: 51 IU/L (ref 39–117)
BUN/Creatinine Ratio: 19 (ref 9–23)
BUN: 11 mg/dL (ref 6–24)
Bilirubin Total: 0.5 mg/dL (ref 0.0–1.2)
CO2: 20 mmol/L (ref 20–29)
Calcium: 9.1 mg/dL (ref 8.7–10.2)
Chloride: 104 mmol/L (ref 96–106)
Creatinine, Ser: 0.58 mg/dL (ref 0.57–1.00)
GFR calc Af Amer: 131 mL/min/{1.73_m2} (ref >59)
GFR calc non Af Amer: 114 mL/min/{1.73_m2} (ref >59)
Globulin, Total: 2.4 g/dL (ref 1.5–4.5)
Glucose: 89 mg/dL (ref 65–99)
Potassium: 4 mmol/L (ref 3.5–5.2)
Sodium: 139 mmol/L (ref 134–144)
Total Protein: 6.8 g/dL (ref 6.0–8.5)

## 2017-03-20 LAB — CBC WITH DIFFERENTIAL/PLATELET
Basophils Absolute: 0 10*3/uL (ref 0.0–0.2)
Basos: 0 %
EOS (ABSOLUTE): 0.1 10*3/uL (ref 0.0–0.4)
Eos: 2 %
Hematocrit: 35.7 % (ref 34.0–46.6)
Hemoglobin: 11.7 g/dL (ref 11.1–15.9)
Immature Grans (Abs): 0 10*3/uL (ref 0.0–0.1)
Immature Granulocytes: 0 %
Lymphocytes Absolute: 2 10*3/uL (ref 0.7–3.1)
Lymphs: 30 %
MCH: 27.7 pg (ref 26.6–33.0)
MCHC: 32.8 g/dL (ref 31.5–35.7)
MCV: 84 fL (ref 79–97)
Monocytes Absolute: 0.4 10*3/uL (ref 0.1–0.9)
Monocytes: 7 %
Neutrophils Absolute: 4.1 10*3/uL (ref 1.4–7.0)
Neutrophils: 61 %
Platelets: 321 10*3/uL (ref 150–379)
RBC: 4.23 x10E6/uL (ref 3.77–5.28)
RDW: 14.3 % (ref 12.3–15.4)
WBC: 6.7 10*3/uL (ref 3.4–10.8)

## 2017-03-20 LAB — VITAMIN D 25 HYDROXY (VIT D DEFICIENCY, FRACTURES): Vit D, 25-Hydroxy: 15.5 ng/mL — ABNORMAL LOW (ref 30.0–100.0)

## 2017-03-20 LAB — FERRITIN: Ferritin: 11 ng/mL — ABNORMAL LOW (ref 15–150)

## 2017-04-07 ENCOUNTER — Other Ambulatory Visit: Payer: Self-pay | Admitting: Family Medicine

## 2017-04-08 ENCOUNTER — Telehealth: Payer: Self-pay | Admitting: Family Medicine

## 2017-04-08 NOTE — Telephone Encounter (Signed)
Results and recommendations given to pt per notes of Dr. Pamella Pert. Pt verbalized understanding. No other concerns voiced at this time.

## 2017-04-22 ENCOUNTER — Ambulatory Visit
Admission: RE | Admit: 2017-04-22 | Discharge: 2017-04-22 | Disposition: A | Discharge status: Home / Self Care | Payer: BLUE CROSS/BLUE SHIELD | Source: Ambulatory Visit | Attending: Family Medicine | Admitting: Family Medicine

## 2017-04-22 DIAGNOSIS — Z1231 Encounter for screening mammogram for malignant neoplasm of breast: Secondary | ICD-10-CM

## 2017-04-23 ENCOUNTER — Other Ambulatory Visit: Payer: Self-pay

## 2017-04-23 ENCOUNTER — Encounter: Payer: Self-pay | Admitting: Family Medicine

## 2017-04-23 ENCOUNTER — Ambulatory Visit: Payer: BLUE CROSS/BLUE SHIELD | Admitting: Family Medicine

## 2017-04-23 VITALS — BP 124/68 | HR 72 | Temp 97.9°F | Wt 121.0 lb

## 2017-04-23 DIAGNOSIS — R790 Abnormal level of blood mineral: Secondary | ICD-10-CM

## 2017-04-23 DIAGNOSIS — E559 Vitamin D deficiency, unspecified: Secondary | ICD-10-CM

## 2017-04-23 MED ORDER — VITAMIN D (ERGOCALCIFEROL) 1.25 MG (50000 UNIT) PO CAPS: 50000.0000 [IU] | ORAL_CAPSULE | ORAL | 0 refills | Status: DC

## 2017-04-23 MED ORDER — FERROUS SULFATE 325 (65 FE) MG PO TABS: 325.0000 mg | ORAL_TABLET | Freq: Every day | ORAL | 3 refills | Status: DC

## 2017-04-23 NOTE — Patient Instructions (Addendum)
     IF you received an x-ray today, you will receive an invoice from [facility]. Please contact [facility] at [phone] with questions or concerns regarding your invoice.   IF you received labwork today, you will receive an invoice from [facility]. Please contact [facility] at [phone] with questions or concerns regarding your invoice.   Our billing staff will not be able to assist you with questions regarding bills from these companies.  You will be contacted with the lab results as soon as they are available. The fastest way to get your results is to activate your My Chart account. Instructions are located on the last page of this paperwork. If you have not heard from [place] regarding the results in 2 weeks, please contact this office.    Examen de anticuerpos para la celiaqua (Celiac Disease Antibodies Test) POR QU ME REALIZO ESTE EXAMEN? El examen de anticuerpos para la celiaqua es un anlisis de sangre que se [place] para determinar si tiene esta enfermedad. Los anticuerpos son protenas que fabrica el cuerpo para protegerlo de microbios y otras cosas que pueden enfermarlo. En la celiaqua, el cuerpo produce anticuerpos en respuesta a las protenas de gluten y gliadina. En lugar de proteger el cuerpo, estos anticuerpos atacan el recubrimiento de los intestinos. Este examen detecta la presencia de anticuerpos comunes para la celiaqua. Puede realizarse el examen si tiene sntomas de [place]. Estos incluyen los siguientes:  Diarrea prolongada (crnica).  Dolor en el vientre (abdominal).  Prdida de peso. Tambin puede realizarse este examen si alguien en su familia es celaco. QU TIPO DE MUESTRA SE TOMA? Se requiere [place] de sangre para Lisa Villarreal. Generalmente, se obtiene mediante la insercin de [place] en una vena. CMO ME PREPARO PARA EL EXAMEN? Es posible que le soliciten una lista de los alimentos que haya ingerido en las 48 horas previas al  examen. Si ha comido alimentos que contienen gluten, el examen mostrar una fuerte respuesta de los anticuerpos si es que tiene [place]. Pida instrucciones especficas a su mdico. CULES SON LOS VALORES DE REFERENCIA? Los valores de referencia son los valores saludables establecidos despus de Optometrist el anlisis a un grupo grande de personas sanas. Pueden variar [employer], laboratorios y hospitales. Es su responsabilidad retirar el resultado del [place]. Consulte en el laboratorio o en el departamento en el que fue realizado el estudio cundo y cmo podr [employer]. Hay tres anticuerpos comunes para la celiaqua:  Anticuerpos antigliadina IgA e IgG.  Anticuerpos antiendomisio IgA.  Anticuerpo antitransglutaminasa tisular IgA. Los valores de referencia para estos tres anticuerpos son los siguientes:  Anticuerpos antigliadina IgA e IgG: ? desde el nacimiento [facility] 2 aos, menos de 20 EU. ? a partir de 3 aos, menos de 25 EU.  Anticuerpos antiendomisio IgA: todas las edades, negativo.  Anticuerpo antitransglutaminasa tisular IgA: todas las edades, menos de 20 EU. [place]? Los niveles elevados de anticuerpos o un resultado positivo pueden significar que tiene [place]. Hable con el mdico para [employer], las opciones de tratamiento y, si es necesario, la necesidad de Optometrist ms [place]. Hable con el mdico si tiene [employer]. Esta informacin no tiene Marine scientist el consejo del mdico. Asegrese de hacerle al mdico cualquier pregunta que tenga. Document Released: 02/09/2013 Document Revised: 05/12/2014 Document Reviewed: 08/17/2013 Elsevier Interactive Patient Education  [employer].

## 2017-04-23 NOTE — Progress Notes (Signed)
12/20/20188:18 AM  Lisa Villarreal Apr 22, 1976, 41 y.o. female [id number]  Chief Complaint  Patient presents with  . Follow-up    mammaogram done yesterday    HPI:   Patient is a 41 y.o. female with past medical history significant for iron def anemia and low vitamin D who presents today for routine fu.  Tolerating OCPs well, has no concerns Had mammogram done yesterday, birads 1 Reports that she eats mostly chicken, fish and eggs. Has spinach every day. She reports that she has noticed a long standing issue with eating lots of "flour" she gets bloated and abd pain. She does not get diarrhea. She has no acute concerns today.  Depression screen [facility] 2/9 04/23/2017 03/19/2017 01/09/2017  Decreased Interest 0 0 0  Down, Depressed, Hopeless 0 0 0  PHQ - 2 Score 0 0 0    Allergies  Allergen Reactions  . Penicillins     REACTION: Swelling    Prior to Admission medications   Medication Sig Start Date End Date Taking? Authorizing Provider  norgestimate-ethinyl estradiol (ORTHO-CYCLEN,SPRINTEC,PREVIFEM) 0.25-35 MG-MCG tablet Take 1 tablet daily by mouth. Patient not taking: Reported on 04/23/2017 03/19/17   Rutherford Guys, MD    Past Medical History:  Diagnosis Date  . Anemia   . Anxiety   . Headache, migraine    resolved, none for years  . Vitamin D deficiency     Past Surgical History:  Procedure Laterality Date  . CESAREAN SECTION      Social History   Tobacco Use  . Smoking status: Never Smoker  . Smokeless tobacco: Never Used  Substance Use Topics  . Alcohol use: No    Family History  Problem Relation Age of Onset  . Leukemia Maternal Aunt     ROS Per hpi  OBJECTIVE:  Blood pressure 124/68, pulse 72, temperature 97.9 F (36.6 C), temperature source Oral, weight 121 lb (54.9 kg), SpO2 100 %.  Physical Exam  Constitutional: She is oriented to person, place, and time and well-developed, well-nourished, and in no distress.  HENT:  Head:  Normocephalic and atraumatic.  Mouth/Throat: Mucous membranes are normal.  Eyes: EOM are normal. Pupils are equal, round, and reactive to light. No scleral icterus.  Neck: Neck supple.  Pulmonary/Chest: Effort normal.  Neurological: She is alert and oriented to person, place, and time. Gait normal.  Skin: Skin is warm and dry.  Psychiatric: Mood and affect normal.  Nursing note and vitals reviewed.    Mm Digital Screening Bilateral  Result Date: 04/22/2017 CLINICAL DATA:  Screening. EXAM: DIGITAL SCREENING BILATERAL MAMMOGRAM WITH CAD COMPARISON:  Previous exam(s). ACR Breast Density Category c: The breast tissue is heterogeneously dense, which may obscure small masses. FINDINGS: There are no findings suspicious for malignancy. Images were processed with CAD. IMPRESSION: No mammographic evidence of malignancy. A result letter of this screening mammogram will be mailed directly to the patient. RECOMMENDATION: Screening mammogram in one year. (Code:SM-B-01Y) BI-RADS CATEGORY  1: Negative. Electronically Signed   By: Dorise Bullion III M.D   On: 04/22/2017 17:29     ASSESSMENT and PLAN 1. Low ferritin - Celiac Disease Ab Screen w/Rfx  2. Vitamin D deficiency - Celiac Disease Ab Screen w/Rfx  Other orders - ferrous sulfate 325 (65 FE) MG tablet; Take 1 tablet (325 mg total) by mouth daily with breakfast. - Vitamin D, Ergocalciferol, (DRISDOL) 50000 units CAPS capsule; Take 1 capsule (50,000 Units total) by mouth every 7 (seven) days.  Return in about 3  months (around 07/22/2017).    Rutherford Guys, MD Primary Care at [place], [place] [address] Ph.  [phone] Fax [phone]

## 2017-04-26 LAB — CELIAC DISEASE AB SCREEN W/RFX
Antigliadin Abs, IgA: 5 units (ref 0–19)
IgA/Immunoglobulin A, Serum: 281 mg/dL (ref 87–352)
Transglutaminase IgA: <2 U/mL (ref 0–3)

## 2017-04-27 ENCOUNTER — Encounter: Payer: Self-pay | Admitting: Family Medicine

## 2017-07-23 ENCOUNTER — Ambulatory Visit: Payer: BLUE CROSS/BLUE SHIELD | Admitting: Family Medicine

## 2017-08-05 ENCOUNTER — Encounter: Payer: Self-pay | Admitting: Physician Assistant

## 2017-08-15 ENCOUNTER — Ambulatory Visit (INDEPENDENT_AMBULATORY_CARE_PROVIDER_SITE_OTHER): Payer: Self-pay | Admitting: Family Medicine

## 2017-08-15 ENCOUNTER — Encounter: Payer: Self-pay | Admitting: Family Medicine

## 2017-08-15 VITALS — BP 118/64 | HR 72 | Temp 98.7°F | Resp 16 | Ht 59.0 in | Wt 122.0 lb

## 2017-08-15 DIAGNOSIS — Z30011 Encounter for initial prescription of contraceptive pills: Secondary | ICD-10-CM

## 2017-08-15 DIAGNOSIS — N750 Cyst of Bartholin's gland: Secondary | ICD-10-CM

## 2017-08-15 DIAGNOSIS — E559 Vitamin D deficiency, unspecified: Secondary | ICD-10-CM

## 2017-08-15 DIAGNOSIS — D509 Iron deficiency anemia, unspecified: Secondary | ICD-10-CM

## 2017-08-15 LAB — POCT CBC
Granulocyte percent: 64.8 %G (ref 37–80)
HCT, POC: 37.1 % — AB (ref 37.7–47.9)
Hemoglobin: 12.5 g/dL (ref 12.2–16.2)
Lymph, poc: 1.9 (ref 0.6–3.4)
MCH, POC: 27.8 pg (ref 27–31.2)
MCHC: 33.6 g/dL (ref 31.8–35.4)
MCV: 82.7 fL (ref 80–97)
MID (cbc): 0.4 (ref 0–0.9)
MPV: 7 fL (ref 0–99.8)
POC Granulocyte: 4.2 (ref 2–6.9)
POC LYMPH PERCENT: 92 %L — AB (ref 10–50)
POC MID %: 6.2 %M (ref 0–12)
Platelet Count, POC: 285 10*3/uL (ref 142–424)
RBC: 4.48 M/uL (ref 4.04–5.48)
RDW, POC: 13.9 %
WBC: 6.5 10*3/uL (ref 4.6–10.2)

## 2017-08-15 LAB — POCT URINE PREGNANCY: Preg Test, Ur: NEGATIVE

## 2017-08-15 MED ORDER — NORETHINDRONE ACET-ETHINYL EST 1-20 MG-MCG PO TABS: 1.0000 | ORAL_TABLET | Freq: Every day | ORAL | 11 refills | Status: DC

## 2017-08-15 NOTE — Progress Notes (Signed)
4/13/20199:31 AM  Lisa Villarreal [birth year], 42 y.o. female [id number]  Chief Complaint  Patient presents with  . Anemia    follow-up visit on low ferritin and vitamin D levels as well  . Groin Swelling    right labia swelling x3 weeks - denies discharge and pain    HPI:   Patient is a 42 y.o. female with past medical history significant for iron def anemia, vit D, menorrhagia who presents today for followup on chronic medical conditions and concerns for 3 weeks of right labial swelling  She has not been taking OCPs, was giving her occasional headaches She has continued to increase her dietary sources of iron and vit d Has been taking rx iron and vit d as prescribed She is feeling well, good enerygy, no dizziness or palpitations She has been doing a daily sitz bath, sometimes swelling gets better but has not resolved Not painful but fullness is uncomfortable Denies any dysuria, hematuria, vaginal discharge, pelvic pain LMP 07/30/17 Using condoms  Depression screen [facility] 2/9 04/23/2017 03/19/2017 01/09/2017  Decreased Interest 0 0 0  Down, Depressed, Hopeless 0 0 0  PHQ - 2 Score 0 0 0    Allergies  Allergen Reactions  . Penicillins     REACTION: Swelling    Prior to Admission medications   Medication Sig Start Date End Date Taking? Authorizing Provider  ferrous sulfate 325 (65 FE) MG tablet Take 1 tablet (325 mg total) by mouth daily with breakfast. 04/23/17  Yes Rutherford Guys, MD  norgestimate-ethinyl estradiol (ORTHO-CYCLEN,SPRINTEC,PREVIFEM) 0.25-35 MG-MCG tablet Take 1 tablet daily by mouth. Patient not taking: Reported on 04/23/2017 03/19/17   Rutherford Guys, MD  Vitamin D, Ergocalciferol, (DRISDOL) 50000 units CAPS capsule Take 1 capsule (50,000 Units total) by mouth every 7 (seven) days. Patient not taking: Reported on 08/15/2017 04/23/17   Rutherford Guys, MD    Past Medical History:  Diagnosis Date  . Anemia   . Anxiety   . Headache, migraine      resolved, none for years  . Vitamin D deficiency     Past Surgical History:  Procedure Laterality Date  . CESAREAN SECTION      Social History   Tobacco Use  . Smoking status: Never Smoker  . Smokeless tobacco: Never Used  Substance Use Topics  . Alcohol use: No    Family History  Problem Relation Age of Onset  . Leukemia Maternal Aunt     ROS Per hpi  OBJECTIVE:  Blood pressure 118/64, pulse 72, temperature 98.7 F (37.1 C), temperature source Oral, resp. rate 16, height 4\' 11"  (1.499 m), weight 122 lb (55.3 kg), SpO2 97 %.  Physical Exam  Constitutional: She is oriented to person, place, and time.  HENT:  Head: Normocephalic and atraumatic.  Mouth/Throat: Mucous membranes are normal.  Eyes: Pupils are equal, round, and reactive to light. EOM are normal. No scleral icterus.  Neck: Neck supple.  Pulmonary/Chest: Effort normal.  Genitourinary:  Genitourinary Comments: Vulva without erythema, rash or lesions Right labia with a firm discrete mobile nodule, no drainage, non-tender, no erythema, no warmth  Neurological: She is alert and oriented to person, place, and time.  Skin: Skin is warm and dry.  Nursing note and vitals reviewed.     Results for orders placed or performed in visit on 08/15/17 (from the past 24 hour(s))  POCT urine pregnancy     Status: None   Collection Time: 08/15/17  9:16 AM  Result Value  Ref Range   Preg Test, Ur Negative Negative  POCT CBC     Status: Abnormal   Collection Time: 08/15/17  9:20 AM  Result Value Ref Range   WBC 6.5 4.6 - 10.2 K/uL   Lymph, poc 1.9 0.6 - 3.4   POC LYMPH PERCENT 92.0 (A) 10 - 50 %L   MID (cbc) 0.4 0 - 0.9   POC MID % 6.2 0 - 12 %M   POC Granulocyte 4.2 2 - 6.9   Granulocyte percent 64.8 37 - 80 %G   RBC 4.48 4.04 - 5.48 M/uL   Hemoglobin 12.5 12.2 - 16.2 g/dL   HCT, POC 37.1 (A) 37.7 - 47.9 %   MCV 82.7 80 - 97 fL   MCH, POC 27.8 27 - 31.2 pg   MCHC 33.6 31.8 - 35.4 g/dL   RDW, POC 13.9 %    Platelet Count, POC 285 142 - 424 K/uL   MPV 7.0 0 - 99.8 fL     ASSESSMENT and PLAN  1. Iron deficiency anemia, unspecified iron deficiency anemia type - Ferritin - POCT CBC  2. Vitamin D deficiency - Vitamin D, 25-hydroxy  3. Encounter for initial prescription of contraceptive pills Discussed BC options again, patient still would like to try OCPs, doing lower estrogen, r/se/b. - POCT urine pregnancy  4. Bartholin gland cyst - Ambulatory referral to Gynecology  Other orders - norethindrone-ethinyl estradiol (MICROGESTIN,JUNEL,LOESTRIN) 1-20 MG-MCG tablet; Take 1 tablet by mouth daily.  Return in about 3 months (around 11/14/2017) for OCPs.    Rutherford Guys, MD Primary Care at [place], [place] [address] Ph.  [phone] Fax [phone]

## 2017-08-15 NOTE — Patient Instructions (Signed)
     IF you received an x-ray today, you will receive an invoice from [facility]. Please contact [facility] at [phone] with questions or concerns regarding your invoice.   IF you received labwork today, you will receive an invoice from [facility]. Please contact [facility] at [phone] with questions or concerns regarding your invoice.   Our billing staff will not be able to assist you with questions regarding bills from these companies.  You will be contacted with the lab results as soon as they are available. The fastest way to get your results is to activate your My Chart account. Instructions are located on the last page of this paperwork. If you have not heard from us regarding the results in 2 weeks, please contact this office.     

## 2017-08-17 LAB — FERRITIN: Ferritin: 19 ng/mL (ref 15–150)

## 2017-08-17 LAB — VITAMIN D 25 HYDROXY (VIT D DEFICIENCY, FRACTURES): Vit D, 25-Hydroxy: 31.4 ng/mL (ref 30.0–100.0)

## 2017-08-23 ENCOUNTER — Encounter: Payer: Self-pay | Admitting: Family Medicine

## 2017-09-21 ENCOUNTER — Encounter: Payer: Self-pay | Admitting: Obstetrics & Gynecology

## 2017-09-21 ENCOUNTER — Ambulatory Visit (INDEPENDENT_AMBULATORY_CARE_PROVIDER_SITE_OTHER): Payer: Self-pay | Admitting: Obstetrics & Gynecology

## 2017-09-21 VITALS — BP 126/78 | Ht 58.75 in | Wt 126.0 lb

## 2017-09-21 DIAGNOSIS — N750 Cyst of Bartholin's gland: Secondary | ICD-10-CM

## 2017-09-21 NOTE — Progress Notes (Signed)
    Lisa Villarreal December 04, 1975 [id number]        41 y.o.  [id number] Married.  Daughters 38, 17 and 27 yo.  RP: Vulvar cyst x 3 months  HPI: Well on Sprintec for contraception as well as heavy menses with anemia.  Patient has felt a deep vulvar cyst on the right side for 3 months.  The cyst is not tender or painful and not draining.  Not bothering her with intercourse.  Urine and bowel movements normal.   OB History  Gravida Para Term Preterm AB Living  4 3 3  0 1 3  SAB TAB Ectopic Multiple Live Births  1 0 0 0      # Outcome Date GA Lbr Len/2nd Weight Sex Delivery Anes PTL Lv  4 Term 2005     Vag-Spont     3 Term 2003     Vag-Spont     2 Term 1999     CS-Classical     1 SAB             Past medical history,surgical history, problem list, medications, allergies, family history and social history were all reviewed and documented in the EPIC chart.   Directed ROS with pertinent positives and negatives documented in the history of present illness/assessment and plan.  Exam:  Vitals:   09/21/17 1411  Weight: 126 lb (57.2 kg)  Height: 4' 10.75" (1.492 m)   General appearance:  Normal  Abdomen: Normal  Gynecologic exam: Small right Bartholin gland cyst about 1.5 cm, NT.  Bimanual exam:  AV Uterus, normal volume, NT.  No adnexal mass, NT.   Assessment/Plan:  42 y.o. [id number]   1. Cyst of right Bartholin's gland Small cyst of the right Bartholin's gland.  No sign of infection.  Not causing pain or discomfort to the patient and not interfering with intercourse.  Patient reassured and agrees with recommendation of observation.  Will reassess at the time of the annual gynecologic exam.  Counseling on above issues and coordination of care more than 50% for 15 minutes.  Princess Bruins MD, 2:19 PM 09/21/2017

## 2017-09-26 ENCOUNTER — Encounter: Payer: Self-pay | Admitting: Obstetrics & Gynecology

## 2017-09-26 NOTE — Patient Instructions (Signed)
1. Cyst of right Bartholin's gland Small cyst of the right Bartholin's gland.  No sign of infection.  Not causing pain or discomfort to the patient and not interfering with intercourse.  Patient reassured and agrees with recommendation of observation.  Will reassess at the time of the annual gynecologic exam.  Lorn Junes, fue un placer encontrarle hoy!

## 2017-11-19 ENCOUNTER — Ambulatory Visit: Payer: Self-pay | Admitting: Family Medicine

## 2017-11-20 ENCOUNTER — Other Ambulatory Visit: Payer: Self-pay

## 2017-11-20 ENCOUNTER — Ambulatory Visit: Payer: Self-pay | Admitting: Family Medicine

## 2017-11-20 ENCOUNTER — Encounter: Payer: Self-pay | Admitting: Family Medicine

## 2017-11-20 VITALS — BP 102/66 | HR 88 | Temp 98.6°F | Ht 60.63 in | Wt 123.0 lb

## 2017-11-20 DIAGNOSIS — Z3041 Encounter for surveillance of contraceptive pills: Secondary | ICD-10-CM

## 2017-11-20 DIAGNOSIS — N920 Excessive and frequent menstruation with regular cycle: Secondary | ICD-10-CM

## 2017-11-20 MED ORDER — NORETHINDRONE ACET-ETHINYL EST 1-20 MG-MCG PO TABS: 1.0000 | ORAL_TABLET | Freq: Every day | ORAL | 4 refills | Status: DC

## 2017-11-20 NOTE — Patient Instructions (Signed)
     IF you received an x-ray today, you will receive an invoice from [facility]. Please contact [facility] at [phone] with questions or concerns regarding your invoice.   IF you received labwork today, you will receive an invoice from [facility]. Please contact [facility] at [phone] with questions or concerns regarding your invoice.   Our billing staff will not be able to assist you with questions regarding bills from these companies.  You will be contacted with the lab results as soon as they are available. The fastest way to get your results is to activate your My Chart account. Instructions are located on the last page of this paperwork. If you have not heard from us regarding the results in 2 weeks, please contact this office.     

## 2017-11-20 NOTE — Progress Notes (Signed)
   7/19/20198:20 AM  Lisa Villarreal [birth year], 42 y.o. female [id number]  Chief Complaint  Patient presents with  . Follow-up    here to chk iron levels    HPI:   Patient is a 42 y.o. female with past medical history significant for anemia secondary to menorrhagia and vitamin d deficiency who presents today for followup after starting low dose OCPs  She has been taking OCP continuously  Tolerating well, denies any side effects Has achieved amenorrhea Is taking a MVI Has no acute concerns today  Lab Results  Component Value Date   WBC 6.5 08/15/2017   HGB 12.5 08/15/2017   HCT 37.1 (A) 08/15/2017   MCV 82.7 08/15/2017   PLT 321 03/19/2017   Lab Results  Component Value Date   FERRITIN 19 08/15/2017   Vitamin D in April 31.4  Fall Risk  11/20/2017 04/23/2017 03/19/2017 01/09/2017 04/30/2016  Falls in the past year? No No No No No     Depression screen [facility] 2/9 11/20/2017 04/23/2017 03/19/2017  Decreased Interest 0 0 0  Down, Depressed, Hopeless 0 0 0  PHQ - 2 Score 0 0 0    Allergies  Allergen Reactions  . Penicillins     REACTION: Swelling    Prior to Admission medications   Medication Sig Start Date End Date Taking? Authorizing Provider  norethindrone-ethinyl estradiol (MICROGESTIN,JUNEL,LOESTRIN) 1-20 MG-MCG tablet Take 1 tablet by mouth daily.  08/15/17   Rutherford Guys, MD    Past Medical History:  Diagnosis Date  . Anemia   . Anxiety   . Headache, migraine    resolved, none for years  . Vitamin D deficiency     Past Surgical History:  Procedure Laterality Date  . CESAREAN SECTION      Social History   Tobacco Use  . Smoking status: Never Smoker  . Smokeless tobacco: Never Used  Substance Use Topics  . Alcohol use: No    Family History  Problem Relation Age of Onset  . Leukemia Maternal Aunt     ROS Per hpi  OBJECTIVE:  Blood pressure 102/66, pulse 88, temperature 98.6 F (37 C), temperature source Oral, height 5'  0.63" (1.54 m), weight 123 lb (55.8 kg), O2 97% RA  Physical Exam  Constitutional: She is oriented to person, place, and time.  HENT:  Head: Normocephalic and atraumatic.  Mouth/Throat: Mucous membranes are normal.  Eyes: Pupils are equal, round, and reactive to light. EOM are normal. No scleral icterus.  Neck: Neck supple.  Pulmonary/Chest: Effort normal.  Neurological: She is alert and oriented to person, place, and time.  Skin: Skin is warm and dry.  Nursing note and vitals reviewed.   ASSESSMENT and PLAN  1. Menorrhagia with regular cycle 2. Encounter for surveillance of contraceptive pills Controlled. Continue current regime.  - norethindrone-ethinyl estradiol (MICROGESTIN,JUNEL,LOESTRIN) 1-20 MG-MCG tablet; Take 1 tablet by mouth daily.   Return in about 4 months (around 03/23/2018) for CPE.    Rutherford Guys, MD Primary Care at [place], [place] [address] Ph.  [phone] Fax [phone]

## 2018-03-25 ENCOUNTER — Encounter: Payer: Self-pay | Admitting: Family Medicine

## 2018-06-23 ENCOUNTER — Ambulatory Visit: Payer: Self-pay | Admitting: Family Medicine

## 2018-06-23 ENCOUNTER — Encounter: Payer: Self-pay | Admitting: Family Medicine

## 2018-06-23 ENCOUNTER — Other Ambulatory Visit: Payer: Self-pay

## 2018-06-23 VITALS — BP 117/70 | HR 78 | Temp 98.0°F | Resp 16 | Ht 59.65 in | Wt 120.0 lb

## 2018-06-23 DIAGNOSIS — Z0000 Encounter for general adult medical examination without abnormal findings: Secondary | ICD-10-CM

## 2018-06-23 DIAGNOSIS — Z1231 Encounter for screening mammogram for malignant neoplasm of breast: Secondary | ICD-10-CM

## 2018-06-23 DIAGNOSIS — Z23 Encounter for immunization: Secondary | ICD-10-CM

## 2018-06-23 NOTE — Progress Notes (Signed)
2/19/20204:02 PM  Lisa Villarreal [birth year], 43 y.o. female [id number]  Chief Complaint  Patient presents with  . Annual Exam    HPI:   Patient is a 43 y.o. female with past medical history significant for menorrhagia, iron def anemia who presents today for CPE  G&Ps: 4,3 Pap: sept 2017, neg pap and HPV STD: BC : stopped OCPs in Nov 2019, as she was having bleeding for a month, she also felt that a benign breast cyst was enlarged, now back to normal, using condoms Menses: since she stopped OCPs, regular, normal Mammogram: 04/2017, negative FHX breast/ovarian cancer: no FHx colon cancer: no Exercise/diet: regulary, normal diet  Most Recent Immunizations  Administered Date(s) Administered  . Influenza Split 06/07/2012  . Influenza Whole 06/04/2009  . Influenza,inj,Quad PF,6+ Mos 01/09/2017  . Tdap 01/26/2015    Fall Risk  06/23/2018 11/20/2017 04/23/2017 03/19/2017 01/09/2017  Falls in the past year? 0 No No No No  Injury with Fall? 0 - - - -     Depression screen [facility] 2/9 06/23/2018 11/20/2017 04/23/2017  Decreased Interest 0 0 0  Down, Depressed, Hopeless 0 0 0  PHQ - 2 Score 0 0 0    Allergies  Allergen Reactions  . Penicillins     REACTION: Swelling    Prior to Admission medications   Medication Sig Start Date End Date Taking? Authorizing Provider  Multiple Vitamin (MULTIVITAMIN) tablet Take 1 tablet by mouth daily.   Yes [provider]  norethindrone-ethinyl estradiol (MICROGESTIN,JUNEL,LOESTRIN) 1-20 MG-MCG tablet Take 1 tablet by mouth daily. 11/20/17  Yes Rutherford Guys, MD    Past Medical History:  Diagnosis Date  . Anemia   . Anxiety   . Headache, migraine    resolved, none for years  . Vitamin D deficiency     Past Surgical History:  Procedure Laterality Date  . CESAREAN SECTION      Social History   Tobacco Use  . Smoking status: Never Smoker  . Smokeless tobacco: Never Used  Substance Use Topics  . Alcohol use: No     Family History  Problem Relation Age of Onset  . Leukemia Maternal Aunt     Review of Systems  Constitutional: Negative for chills and fever.  Respiratory: Negative for cough and shortness of breath.   Cardiovascular: Negative for chest pain, palpitations and leg swelling.  Gastrointestinal: Negative for abdominal pain, nausea and vomiting.  All other systems reviewed and are negative. per hpi  OBJECTIVE:  Blood pressure 117/70, pulse 78, temperature 98 F (36.7 C), temperature source Oral, resp. rate 16, height 4' 11.65" (1.515 m), weight 120 lb (54.4 kg), last menstrual period 06/03/2018, SpO2 98 %. Body mass index is 23.71 kg/m.   Physical Exam Vitals signs and nursing note reviewed. Exam conducted with a chaperone present.  Constitutional:      Appearance: She is well-developed.  HENT:     Head: Normocephalic and atraumatic.     Right Ear: Hearing, tympanic membrane, ear canal and external ear normal.     Left Ear: Hearing, tympanic membrane, ear canal and external ear normal.  Eyes:     Conjunctiva/sclera: Conjunctivae normal.     Pupils: Pupils are equal, round, and reactive to light.  Neck:     Musculoskeletal: Neck supple.     Thyroid: No thyromegaly.  Cardiovascular:     Rate and Rhythm: Normal rate and regular rhythm.     Heart sounds: Normal heart sounds. No murmur. No friction rub.  No gallop.   Pulmonary:     Effort: Pulmonary effort is normal.     Breath sounds: Normal breath sounds. No wheezing or rales.  Chest:     Breasts: Breasts are symmetrical.        Right: No mass, nipple discharge or skin change.        Left: No mass, nipple discharge or skin change.  Abdominal:     General: Bowel sounds are normal. There is no distension.     Palpations: Abdomen is soft. There is no mass.     Tenderness: There is no abdominal tenderness.  Musculoskeletal: Normal range of motion.  Lymphadenopathy:     Cervical: No cervical adenopathy.     Upper Body:      Right upper body: No supraclavicular, axillary or pectoral adenopathy.     Left upper body: No supraclavicular, axillary or pectoral adenopathy.  Skin:    General: Skin is warm and dry.  Neurological:     Mental Status: She is alert and oriented to person, place, and time.     Deep Tendon Reflexes: Reflexes are normal and symmetric.     ASSESSMENT and PLAN  1. Annual physical exam No concerns per history or exam. Routine HCM labs ordered. HCM reviewed/discussed. Anticipatory guidance regarding healthy weight, lifestyle and choices given.  - CBC with Differential/Platelet - Comprehensive metabolic panel - Lipid panel - TSH  2. Visit for screening mammogram - Ambulatory Referral to [facility]  3. Need for prophylactic vaccination and inoculation against influenza - Flu Vaccine QUAD 36+ mos IM   Return in about 1 year (around 06/24/2019).    Rutherford Guys, MD Primary Care at [place], [place] [address] Ph.  [phone] Fax [phone]

## 2018-06-23 NOTE — Patient Instructions (Addendum)
If you have lab work done today you will be contacted with your lab results within the next 2 weeks.  If you have not heard from [place] then please contact us. The fastest way to get your results is to register for My Chart.   IF you received an x-ray today, you will receive an invoice from [facility]. Please contact [facility] at [phone] with questions or concerns regarding your invoice.   IF you received labwork today, you will receive an invoice from [facility]. Please contact [facility] at [phone] with questions or concerns regarding your invoice.   Our billing staff will not be able to assist you with questions regarding bills from these companies.  You will be contacted with the lab results as soon as they are available. The fastest way to get your results is to activate your My Chart account. Instructions are located on the last page of this paperwork. If you have not heard from [place] regarding the results in 2 weeks, please contact this office.      Preventive Care 18-39 Years, Female Preventive care refers to lifestyle choices and visits with your health care provider that can promote health and wellness. What does preventive care include?   A yearly physical exam. This is also called an annual well check.  Dental exams once or twice a year.  Routine eye exams. Ask your health care provider how often you should have your eyes checked.  Personal lifestyle choices, including: ? Daily care of your teeth and gums. ? Regular physical activity. ? Eating a healthy diet. ? Avoiding tobacco and drug use. ? Limiting alcohol use. ? Practicing safe sex. ? Taking vitamin and mineral supplements as recommended by your health care provider. What happens during an annual well check? The services and screenings done by your health care provider during your annual well check will depend on your age, overall health, lifestyle risk factors, and family history of  disease. Counseling Your health care provider may ask you questions about your:  Alcohol use.  Tobacco use.  Drug use.  Emotional well-being.  Home and relationship well-being.  Sexual activity.  Eating habits.  Work and work Statistician.  Method of birth control.  Menstrual cycle.  Pregnancy history. Screening You may have the following tests or measurements:  Height, weight, and BMI.  Diabetes screening. This is done by checking your blood sugar (glucose) after you have not eaten for a while (fasting).  Blood pressure.  Lipid and cholesterol levels. These may be checked every 5 years starting at age 20.  Skin check.  Hepatitis C blood test.  Hepatitis B blood test.  Sexually transmitted disease (STD) testing.  BRCA-related cancer screening. This may be done if you have a family history of breast, ovarian, tubal, or peritoneal cancers.  Pelvic exam and Pap test. This may be done every 3 years starting at age 27. Starting at age 19, this may be done every 5 years if you have a Pap test in combination with an HPV test. Discuss your test results, treatment options, and if necessary, the need for more tests with your health care provider. Vaccines Your health care provider may recommend certain vaccines, such as:  Influenza vaccine. This is recommended every year.  Tetanus, diphtheria, and acellular pertussis (Tdap, Td) vaccine. You may need a Td booster every 10 years.  Varicella vaccine. You may need this if you have not been vaccinated.  HPV vaccine. If you are 26 or younger,  you may need three doses over 6 months.  Measles, mumps, and rubella (MMR) vaccine. You may need at least one dose of MMR. You may also need a second dose.  Pneumococcal 13-valent conjugate (PCV13) vaccine. You may need this if you have certain conditions and were not previously vaccinated.  Pneumococcal polysaccharide (PPSV23) vaccine. You may need one or two doses if you smoke  cigarettes or if you have certain conditions.  Meningococcal vaccine. One dose is recommended if you are age 87-21 years and a first-year college student living in a residence hall, or if you have one of several medical conditions. You may also need additional booster doses.  Hepatitis A vaccine. You may need this if you have certain conditions or if you travel or work in places where you may be exposed to hepatitis A.  Hepatitis B vaccine. You may need this if you have certain conditions or if you travel or work in places where you may be exposed to hepatitis B.  Haemophilus influenzae type b (Hib) vaccine. You may need this if you have certain risk factors. Talk to your health care provider about which screenings and vaccines you need and how often you need them. This information is not intended to replace advice given to you by your health care provider. Make sure you discuss any questions you have with your health care provider. Document Released: 06/17/2001 Document Revised: 12/02/2016 Document Reviewed: 02/20/2015 Elsevier Interactive Patient Education  2019 [employer].

## 2018-06-24 LAB — CBC WITH DIFFERENTIAL/PLATELET
Basophils Absolute: 0.1 10*3/uL (ref 0.0–0.2)
Basos: 1 %
EOS (ABSOLUTE): 0.2 10*3/uL (ref 0.0–0.4)
Eos: 2 %
Hematocrit: 35.5 % (ref 34.0–46.6)
Hemoglobin: 12.3 g/dL (ref 11.1–15.9)
Immature Grans (Abs): 0 10*3/uL (ref 0.0–0.1)
Immature Granulocytes: 0 %
Lymphocytes Absolute: 2.8 10*3/uL (ref 0.7–3.1)
Lymphs: 29 %
MCH: 28.7 pg (ref 26.6–33.0)
MCHC: 34.6 g/dL (ref 31.5–35.7)
MCV: 83 fL (ref 79–97)
Monocytes Absolute: 0.6 10*3/uL (ref 0.1–0.9)
Monocytes: 6 %
Neutrophils Absolute: 6.1 10*3/uL (ref 1.4–7.0)
Neutrophils: 62 %
Platelets: 334 10*3/uL (ref 150–450)
RBC: 4.29 x10E6/uL (ref 3.77–5.28)
RDW: 12.6 % (ref 11.7–15.4)
WBC: 9.7 10*3/uL (ref 3.4–10.8)

## 2018-06-24 LAB — COMPREHENSIVE METABOLIC PANEL
ALT: 18 IU/L (ref 0–32)
AST: 17 IU/L (ref 0–40)
Albumin/Globulin Ratio: 2 (ref 1.2–2.2)
Albumin: 4.7 g/dL (ref 3.8–4.8)
Alkaline Phosphatase: 48 IU/L (ref 39–117)
BUN/Creatinine Ratio: 19 (ref 9–23)
BUN: 14 mg/dL (ref 6–24)
Bilirubin Total: 0.2 mg/dL (ref 0.0–1.2)
CO2: 22 mmol/L (ref 20–29)
Calcium: 9.1 mg/dL (ref 8.7–10.2)
Chloride: 102 mmol/L (ref 96–106)
Creatinine, Ser: 0.74 mg/dL (ref 0.57–1.00)
GFR calc Af Amer: 115 mL/min/{1.73_m2} (ref >59)
GFR calc non Af Amer: 100 mL/min/{1.73_m2} (ref >59)
Globulin, Total: 2.3 g/dL (ref 1.5–4.5)
Glucose: 84 mg/dL (ref 65–99)
Potassium: 4.1 mmol/L (ref 3.5–5.2)
Sodium: 138 mmol/L (ref 134–144)
Total Protein: 7 g/dL (ref 6.0–8.5)

## 2018-06-24 LAB — TSH: TSH: 2.76 u[IU]/mL (ref 0.450–4.500)

## 2018-06-24 LAB — LIPID PANEL
Chol/HDL Ratio: 2.7 ratio (ref 0.0–4.4)
Cholesterol, Total: 166 mg/dL (ref 100–199)
HDL: 62 mg/dL (ref >39)
LDL Calculated: 78 mg/dL (ref 0–99)
Triglycerides: 132 mg/dL (ref 0–149)
VLDL Cholesterol Cal: 26 mg/dL (ref 5–40)

## 2018-07-19 ENCOUNTER — Other Ambulatory Visit: Payer: Self-pay | Admitting: Family Medicine

## 2018-07-19 DIAGNOSIS — Z1231 Encounter for screening mammogram for malignant neoplasm of breast: Secondary | ICD-10-CM

## 2018-09-23 ENCOUNTER — Encounter: Payer: Self-pay | Admitting: Obstetrics & Gynecology

## 2018-09-29 ENCOUNTER — Ambulatory Visit
Admission: RE | Admit: 2018-09-29 | Discharge: 2018-09-29 | Disposition: A | Discharge status: Home / Self Care | Payer: No Typology Code available for payment source | Source: Ambulatory Visit | Attending: Family Medicine | Admitting: Family Medicine

## 2018-09-29 ENCOUNTER — Other Ambulatory Visit: Payer: Self-pay

## 2018-09-29 DIAGNOSIS — Z1231 Encounter for screening mammogram for malignant neoplasm of breast: Secondary | ICD-10-CM

## 2018-11-18 ENCOUNTER — Encounter: Payer: Self-pay | Admitting: Obstetrics & Gynecology

## 2018-12-14 ENCOUNTER — Encounter: Payer: Self-pay | Admitting: Obstetrics & Gynecology

## 2018-12-21 ENCOUNTER — Other Ambulatory Visit: Payer: Self-pay

## 2018-12-22 ENCOUNTER — Encounter: Payer: Self-pay | Admitting: Obstetrics & Gynecology

## 2019-03-01 ENCOUNTER — Encounter: Payer: Self-pay | Admitting: Obstetrics & Gynecology

## 2019-03-01 ENCOUNTER — Other Ambulatory Visit: Payer: Self-pay

## 2019-03-01 ENCOUNTER — Ambulatory Visit (INDEPENDENT_AMBULATORY_CARE_PROVIDER_SITE_OTHER): Payer: Self-pay | Admitting: Obstetrics & Gynecology

## 2019-03-01 VITALS — BP 102/60 | Ht 58.5 in | Wt 112.0 lb

## 2019-03-01 DIAGNOSIS — Z23 Encounter for immunization: Secondary | ICD-10-CM

## 2019-03-01 DIAGNOSIS — Z01419 Encounter for gynecological examination (general) (routine) without abnormal findings: Secondary | ICD-10-CM

## 2019-03-01 DIAGNOSIS — Z789 Other specified health status: Secondary | ICD-10-CM

## 2019-03-01 NOTE — Addendum Note (Signed)
Addended by: Thurnell Garbe A on: 03/01/2019 04:57 PM   Modules accepted: Orders

## 2019-03-01 NOTE — Patient Instructions (Signed)
1. Encounter for routine gynecological examination with Papanicolaou smear of cervix Normal gynecologic exam.  Had 2 episodes of menstrual periods at 6 weeks and 3 weeks intervals, otherwise normal regular periods every month with normal flow.  Will observe.  Patient will call back if has persistent irregular bleeding and will investigate with lab work and pelvic ultrasound.  Pap reflex done.  Breast exam normal.  Screening mammogram May 2020 was negative.  Health labs with family physician.  Good body mass index at 23.01.  Continue with fitness and healthy nutrition.  2. Use of condoms for contraception Satisfied with condom use for contraception.  Declines alternatives.  Other orders - cholecalciferol (VITAMIN D3) 25 MCG (1000 UT) tablet; Take 1,000 Units by mouth daily.  Lisa Villarreal, it was a pleasure seeing you today!  I will inform you of your results as soon as they are available.

## 2019-03-01 NOTE — Addendum Note (Signed)
Addended by: Thurnell Garbe A on: 03/01/2019 05:07 PM   Modules accepted: Orders

## 2019-03-01 NOTE — Progress Notes (Signed)
[facility] 10-02-1975 [id number]   History:    43 y.o. [id number] Married.  Daughters 29, 4, 21 yo.  RP:  Established patient presenting for annual gyn exam   HPI: Menses every month, normal flow.  Except on 2 occasions when her menses were at 6 wks, then at 3 wks intervals.  No BTB.  No pelvic pain.  Using condoms.  No pain with IC.  Urien/BMs normal.  Breasts normal.  BMI 23.01.  Good fitness, cardio 5 times a week.  Healthy nutrition.  Health labs with Fam MD.  Past medical history,surgical history, family history and social history were all reviewed and documented in the EPIC chart.  Gynecologic History Patient's last menstrual period was 02/09/2019. Contraception: condoms Last Pap: 01/2016. Results were: Negative/HPV HR neg Last mammogram: 09/2018. Results were: Negative Bone Density: Never Colonoscopy: Never  Obstetric History OB History  Gravida Para Term Preterm AB Living  4 3 3  0 1 3  SAB TAB Ectopic Multiple Live Births  1 0 0 0      # Outcome Date GA Lbr Len/2nd Weight Sex Delivery Anes PTL Lv  4 Term 2005     Vag-Spont     3 Term 2003     Vag-Spont     2 Term 1999     CS-Classical     1 SAB              ROS: A ROS was performed and pertinent positives and negatives are included in the history.  GENERAL: No fevers or chills. HEENT: No change in vision, no earache, sore throat or sinus congestion. NECK: No pain or stiffness. CARDIOVASCULAR: No chest pain or pressure. No palpitations. PULMONARY: No shortness of breath, cough or wheeze. GASTROINTESTINAL: No abdominal pain, nausea, vomiting or diarrhea, melena or bright red blood per rectum. GENITOURINARY: No urinary frequency, urgency, hesitancy or dysuria. MUSCULOSKELETAL: No joint or muscle pain, no back pain, no recent trauma. DERMATOLOGIC: No rash, no itching, no lesions. ENDOCRINE: No polyuria, polydipsia, no heat or cold intolerance. No recent change in weight. HEMATOLOGICAL: No anemia or easy bruising or  bleeding. NEUROLOGIC: No headache, seizures, numbness, tingling or weakness. PSYCHIATRIC: No depression, no loss of interest in normal activity or change in sleep pattern.     Exam:   BP 102/60   Ht 4' 10.5" (1.486 m)   Wt 112 lb (50.8 kg)   LMP 02/09/2019   BMI 23.01 kg/m   Body mass index is 23.01 kg/m.  General appearance : Well developed well nourished female. No acute distress HEENT: Eyes: no retinal hemorrhage or exudates,  Neck supple, trachea midline, no carotid bruits, no thyroidmegaly Lungs: Clear to auscultation, no rhonchi or wheezes, or rib retractions  Heart: Regular rate and rhythm, no murmurs or gallops Breast:Examined in sitting and supine position were symmetrical in appearance, no palpable masses or tenderness,  no skin retraction, no nipple inversion, no nipple discharge, no skin discoloration, no axillary or supraclavicular lymphadenopathy Abdomen: no palpable masses or tenderness, no rebound or guarding Extremities: no edema or skin discoloration or tenderness  Pelvic: Vulva: Normal             Vagina: No gross lesions or discharge  Cervix: No gross lesions or discharge.  Pap reflex done.  Uterus  AV, normal size, shape and consistency, non-tender and mobile  Adnexa  Without masses or tenderness  Anus: Normal   Assessment/Plan:  43 y.o. female for annual exam   1. Encounter for  routine gynecological examination with Papanicolaou smear of cervix Normal gynecologic exam.  Had 2 episodes of menstrual periods at 6 weeks and 3 weeks intervals, otherwise normal regular periods every month with normal flow.  Will observe.  Patient will call back if has persistent irregular bleeding and will investigate with lab work and pelvic ultrasound.  Pap reflex done.  Breast exam normal.  Screening mammogram May 2020 was negative.  Health labs with family physician.  Good body mass index at 23.01.  Continue with fitness and healthy nutrition.  2. Use of condoms for  contraception Satisfied with condom use for contraception.  Declines alternatives.  Other orders - cholecalciferol (VITAMIN D3) 25 MCG (1000 UT) tablet; Take 1,000 Units by mouth daily.  Princess Bruins MD, 4:10 PM 03/01/2019

## 2019-03-02 LAB — PAP IG W/ RFLX HPV ASCU

## 2019-03-28 ENCOUNTER — Ambulatory Visit (INDEPENDENT_AMBULATORY_CARE_PROVIDER_SITE_OTHER): Payer: Self-pay | Admitting: Women's Health

## 2019-03-28 ENCOUNTER — Other Ambulatory Visit: Payer: Self-pay

## 2019-03-28 ENCOUNTER — Encounter: Payer: Self-pay | Admitting: Women's Health

## 2019-03-28 VITALS — BP 124/70

## 2019-03-28 DIAGNOSIS — N920 Excessive and frequent menstruation with regular cycle: Secondary | ICD-10-CM

## 2019-03-28 LAB — PREGNANCY, URINE: Preg Test, Ur: NEGATIVE

## 2019-03-28 MED ORDER — NORETHIN ACE-ETH ESTRAD-FE 1-20 MG-MCG PO TABS: 1.0000 | ORAL_TABLET | Freq: Every day | ORAL | 4 refills | Status: DC

## 2019-03-28 MED ORDER — MEGESTROL ACETATE 40 MG PO TABS: 40.0000 mg | ORAL_TABLET | Freq: Two times a day (BID) | ORAL | 0 refills | Status: DC

## 2019-03-28 NOTE — Patient Instructions (Addendum)
menorragia Menorrhagia  La menorragia es una afeccin por la cual los perodos menstruales son muy abundantes o duran ms de lo normal. La mayora de los perodos de una mujer con menorragia pueden causar una prdida de sangre abundante y clicos abdominales que le impidan realizar sus actividades habituales. Cules son las causas? Las causas ms frecuentes de esta afeccin incluyen las siguientes:  Formaciones no cancerosas en el tero (plipos o fibromas).  Un desequilibrio entre las hormonas estrgeno y Immunologist.  Uno de los ovarios no libera vulos durante uno o ms meses.  Un problema con la glndula tiroidea (hipotiroidismo).  Efectos secundarios por haberse colocado un dispositivo intrauterino (DIU).  Efectos secundarios por algunos medicamentos, como antiinflamatorios o anticoagulantes.  Un trastorno hemorrgico que impide la Transport planner. En algunos casos, se desconoce la causa de este trastorno. Cules son los signos o los sntomas? Los sntomas de esta afeccin incluyen lo siguiente:  Tiene que cambiar el apsito o el tampn cada 1 o 2 horas debido a que est completamente empapado.  Necesita usar apsitos y tampones al mismo tiempo porque pierde [employer].  Debe levantarse para cambiarse el apsito o el tampn durante la noche.  Elimina cogulos ms grandes de 1 pulgada (2,5 cm).  El sangrado dura ms de 7 das.  Tiene sntomas de niveles bajos de hierro (anemia), como cansancio, fatiga o falta de aire. Cmo se diagnostica? Esta afeccin se puede diagnosticar en funcin de lo siguiente:  Un examen fsico.  Sus sntomas y antecedentes menstruales.  Estudios, por ejemplo: ? Anlisis de sangre para verificar si est embarazada o tiene cambios hormonales, trastornos de la tiroides o hemorrgicos, anemia, u otros problemas. ? Prueba de Papanicolaou para verificar cambios malignos, infecciones o inflamacin. ? Biopsia de endometrio. Esta  prueba implica retirar Truddie Coco de tejido de la pared del tero (endometrio) para examinarlo con microscopio. ? Ecografa plvica. Este estudio [place] ondas de sonido para tomar imgenes del tero, los ovarios y Geneticist, molecular. Las imgenes pueden mostrar si tiene fibromas u otros crecimientos. ? Histeroscopia. Para este estudio se [place] un pequeo telescopio para ver dentro de su tero. Cmo se trata? Es posible que no se requiera tratamiento para esta afeccin. En caso de ser necesario, el mejor tratamiento para usted depender de lo siguiente:  Si necesita Environmental health practitioner.  Si desea tener hijos en el futuro.  La causa y la gravedad del sangrado.  Su preferencia personal. Los medicamentos son Software engineer. Puede recibir [employer] siguientes tratamientos:  Mtodos anticonceptivos hormonales. Estos tratamientos reducen el sangrado durante el perodo menstrual. Estos incluyen los siguientes: ? Anticonceptivos orales. ? Parche drmico. ? Anillo vaginal. ? Inyecciones que recibe cada 3 meses. ? DIU hormonal (dispositivo intrauterino). ? Implantes que se colocan debajo de la piel.  Medicamentos que espesan la sangre y hacen ms lento el sangrado.  Medicamentos que reducen la inflamacin, como el ibuprofeno.  Medicamentos que contienen una hormona artificial (sinttica) llamada progestina.  Medicamentos que [employer] ovarios dejen de funcionar durante un breve lapso.  Suplementos de hierro para tratar la anemia. Si los medicamentos no [facility], puede ser [place]. Algunas opciones quirrgicas son las siguientes:  Dilatacin y curetaje (D y C). En este procedimiento, su mdico abre (dilata) el cuello uterino y luego raspa o succiona tejido del endometrio para disminuir el sangrado menstrual.  Histeroscopia quirrgica. En este procedimiento, se utiliza un pequeo tubo con una luz en el  extremo (histeroscopio) para observar el tero y  ayudar en la extirpacin de un plipo que puede ser la causa de perodos abundantes.  Ablacin del endometrio. Se usan varias tcnicas para destruir permanentemente todo el endometrio. Luego de la ablacin del endometrio, la mayora de las mujeres tienen escaso flujo menstrual, o no lo tienen. Este procedimiento reduce la posibilidad de quedar embarazada.  Reseccin del endometrio. En este procedimiento, se [place] un asa de alambre electroquirrgica para extirpar el endometrio. Este procedimiento reduce la posibilidad de quedar embarazada.  Histerectoma. Es la remocin [place] del tero. Es un procedimiento permanente que interrumpe los perodos [place]. No es posible quedar embarazada luego de una histerectoma. Siga estas instrucciones en su casa: Medicamentos  [employer] recetados y de venta libre exactamente como se lo haya indicado su mdico. Esto incluye pldoras de suplemento de hierro.  No cambie ni reemplace los medicamentos sin consultarlo con su mdico.  No tome aspirina ni medicamentos que contengan aspirina desde 1 semana antes ni durante el perodo menstrual. La aspirina puede hacer que la hemorragia empeore. Instrucciones generales  Si necesita cambiar el apsito o el tampn ms de una vez cada 2horas, limite su actividad hasta que la hemorragia se detenga.  Las pldoras de suplemento de hierro pueden Recruitment consultant. A fin de prevenir o tratar el estreimiento mientras toma suplementos de hierro recetados, el mdico puede recomendarle lo siguiente: ? Electronics engineer suficiente lquido para mantener la orina clara o de color amarillo plido. ? Tomar medicamentos recetados o de [employer]. ? Consumir alimentos ricos en fibra, como frutas y verduras frescas, cereales integrales y frijoles. ? Limitar el consumo de alimentos con alto contenido de grasas y azcares procesados, como alimentos fritos o dulces.  Seguir una dieta balanceada, lo que incluye alimentos con  alto contenido de hierro. Entre estos alimentos se incluyen vegetales de [place], carne, [place], [place] y panes y Psychologist, prison and probation services.  No trate de perder peso hasta que la hemorragia anormal se detenga y los niveles de hierro en la sangre vuelvan a la normalidad. Si debe perder peso, hable con su mdico para hacerlo de manera segura.  Concurra a todas las visitas de control como se lo haya indicado el mdico. Esto es importante. Comunquese con un mdico si:  Empapa un tampn o un apsito cada 1 o 2 horas, y [employer] ocurre cada vez que tiene el perodo.  Necesita usar apsitos y tampones al mismo tiempo porque pierde [employer].  Tiene nuseas, vmitos, diarrea u otros problemas relacionados con los medicamentos que est tomando. Solicite ayuda de inmediato si:  Empapa ms de un apsito o tampn en 1 hora.  Elimina cogulos ms grandes que 1 pulgada (2,5 cm).  Le falta el aire.  Siente que el corazn late [place] rpido.  Se siente mareada o se desmaya.  Se siente muy dbil o cansada. Resumen  La menorragia es una afeccin por la cual los perodos menstruales son muy abundantes o duran ms de lo normal.  El tratamiento depender de la causa de la afeccin y puede incluir medicamentos o procedimientos.  Tome los [employer] recetados y de venta libre exactamente como se lo haya indicado su mdico. Esto incluye pldoras de suplemento de hierro.  Busque ayuda de inmediato si tiene sangrado abundante y empapa ms de un apsito o tampn en 1 hora, si despide cogulos grandes o si se siente mareada, se desmaya o le falta de aire. Esta informacin no tiene Marine scientist el consejo  del mdico. Asegrese de hacerle al mdico cualquier pregunta que tenga. Document Released: 01/29/2005 Document Revised: 08/08/2016 Document Reviewed: 08/08/2016 Elsevier Patient Education  [place].

## 2019-03-28 NOTE — Progress Notes (Signed)
43 year old MWF G4 P3 presents with complaint of heavy cycles lasting for 10 days.  Interpreter present.  Married/same partner condoms consistently.  Minimal cramping, heavy flow changing protection every 1-2 hours.  History of anemia primary care had put her on Sprintec in the past but stopped due to questionable breast changes and fear of breast cancer.  Denies vertigo, urinary symptoms, abdominal/back pain, vaginal discharge or fever.   Normal CBC and TSH 06/2018.  History of menorrhagia, cycles usually 6 to 7 days.  First time cycle lasted greater than 7 days.  No known medical problems, healthy lifestyle with regular exercise and healthy diet.  Going to [place] middle of December, has not been back for 20 years for children to meet grandparents.  Exam: Appears well.  BP 124/70.  No CVAT.  Abdomen soft, nontender, external genitalia within normal limits, speculum exam copious menses type blood, cervix without visible lesion, bimanual no CMT or adnexal tenderness. UPT negative  Menorrhagia  Plan: Megace 40 mg p.o. twice daily daily for 10 days and then start on Loestrin 1/20 prescription, proper use given and reviewed.  Reviewed may have some irregular bleeding first few months.  Instructed to call if bleeding persists after Megace.  Sonohysterogram offered, will wait and if cycles become regular and less heavy will continue on Loestrin.  If cycles greater than 7 days with heavy flow will proceed with sonohysterogram with Dr. Dellis Filbert.

## 2019-07-18 ENCOUNTER — Other Ambulatory Visit: Payer: Self-pay

## 2019-07-19 ENCOUNTER — Encounter: Payer: Self-pay | Admitting: Obstetrics & Gynecology

## 2019-07-19 ENCOUNTER — Ambulatory Visit (INDEPENDENT_AMBULATORY_CARE_PROVIDER_SITE_OTHER): Payer: 59 | Admitting: Obstetrics & Gynecology

## 2019-07-19 VITALS — BP 114/78

## 2019-07-19 DIAGNOSIS — N920 Excessive and frequent menstruation with regular cycle: Secondary | ICD-10-CM

## 2019-07-19 DIAGNOSIS — Z3041 Encounter for surveillance of contraceptive pills: Secondary | ICD-10-CM

## 2019-07-19 NOTE — Progress Notes (Signed)
    Lisa Villarreal [birth year] [id number]        44 y.o.  [id number]   RP:  Counseling on Menorrhagia and BCPs  HPI: Had very heavy menses every month.  Started on the generic of LoEstrin Fe 1/20 in 03/2019 for that reason and is doing very well on it.  Since then, she is having very light menstrual periods.  Minimal rare BTB.  No pelvic pain.  Normal vaginal secretions.   OB History  Gravida Para Term Preterm AB Living  4 3 3  0 1 3  SAB TAB Ectopic Multiple Live Births  1 0 0 0      # Outcome Date GA Lbr Len/2nd Weight Sex Delivery Anes PTL Lv  4 Term 2005     Vag-Spont     3 Term 2003     Vag-Spont     2 Term 1999     CS-Classical     1 SAB             Past medical history,surgical history, problem list, medications, allergies, family history and social history were all reviewed and documented in the EPIC chart.   Directed ROS with pertinent positives and negatives documented in the history of present illness/assessment and plan.  Exam:  Vitals:   07/19/19 0810  BP: 114/78   General appearance:  Normal   Gynecologic exam: Deferred   Assessment/Plan:  44 y.o. [id number]   1. Menorrhagia with regular cycle Controled on the generic of LoEstrin Fe 1/20.  No CI to continue.  Mechanism of action of the BCPs discussed and explained to patient.  Will continue with same treatment.  2. Encounter for surveillance of contraceptive pills Well on BCPs for contraception and cycle control.  No CI.  Continue on same BCPs.    F/U Annual/Gyn visit 02/2020.  Princess Bruins MD, 8:25 AM 07/19/2019

## 2019-07-19 NOTE — Patient Instructions (Signed)
  1. Menorrhagia with regular cycle Controled on the generic of LoEstrin Fe 1/20.  No CI to continue.  Mechanism of action of the BCPs discussed and explained to patient.  Will continue with same treatment.  2. Encounter for surveillance of contraceptive pills Well on BCPs for contraception and cycle control.  No CI.  Continue on same BCPs.    F/U Annual/Gyn visit 02/2020.  Lorn Junes,

## 2019-08-10 ENCOUNTER — Encounter: Payer: Self-pay | Admitting: Family Medicine

## 2019-08-10 ENCOUNTER — Ambulatory Visit (INDEPENDENT_AMBULATORY_CARE_PROVIDER_SITE_OTHER): Payer: 59 | Admitting: Family Medicine

## 2019-08-10 VITALS — BP 123/70 | HR 77 | Temp 98.6°F | Ht 60.0 in | Wt 117.2 lb

## 2019-08-10 DIAGNOSIS — Z130 Encounter for screening for diseases of the blood and blood-forming organs and certain disorders involving the immune mechanism: Secondary | ICD-10-CM

## 2019-08-10 DIAGNOSIS — Z0001 Encounter for general adult medical examination with abnormal findings: Secondary | ICD-10-CM

## 2019-08-10 DIAGNOSIS — Z13220 Encounter for screening for lipoid disorders: Secondary | ICD-10-CM | POA: Diagnosis not present

## 2019-08-10 DIAGNOSIS — E559 Vitamin D deficiency, unspecified: Secondary | ICD-10-CM

## 2019-08-10 DIAGNOSIS — Z0000 Encounter for general adult medical examination without abnormal findings: Secondary | ICD-10-CM

## 2019-08-10 DIAGNOSIS — Z114 Encounter for screening for human immunodeficiency virus [HIV]: Secondary | ICD-10-CM

## 2019-08-10 DIAGNOSIS — Z13228 Encounter for screening for other metabolic disorders: Secondary | ICD-10-CM

## 2019-08-10 DIAGNOSIS — Z1329 Encounter for screening for other suspected endocrine disorder: Secondary | ICD-10-CM

## 2019-08-10 NOTE — Patient Instructions (Addendum)
   If you have lab work done today you will be contacted with your lab results within the next 2 weeks.  If you have not heard from us then please contact us. The fastest way to get your results is to register for My Chart.   IF you received an x-ray today, you will receive an invoice from [facility]. Please contact [facility] at [phone] with questions or concerns regarding your invoice.   IF you received labwork today, you will receive an invoice from [facility]. Please contact [facility] at [phone] with questions or concerns regarding your invoice.   Our billing staff will not be able to assist you with questions regarding bills from these companies.  You will be contacted with the lab results as soon as they are available. The fastest way to get your results is to activate your My Chart account. Instructions are located on the last page of this paperwork. If you have not heard from us regarding the results in 2 weeks, please contact this office.     Mantenimiento de la salud en las mujeres Health Maintenance, Female Adoptar un estilo de vida saludable y recibir atencin preventiva son importantes para promover la salud y el bienestar. Consulte al mdico sobre:  El esquema adecuado para hacerse pruebas y exmenes peridicos.  Cosas que puede hacer por su cuenta para prevenir enfermedades y mantenerse sana. Qu debo saber sobre la dieta, el peso y el ejercicio? Consuma una dieta saludable   Consuma una dieta que incluya muchas verduras, frutas, productos lcteos con bajo contenido de grasa y protenas magras.  No consuma muchos alimentos ricos en grasas slidas, azcares agregados o sodio. Mantenga un peso saludable El ndice de masa muscular (IMC) se utiliza para identificar problemas de peso. Proporciona una estimacin de la grasa corporal basndose en el peso y la altura. Su mdico puede ayudarle a determinar su IMC y a lograr o mantener un peso  saludable. Haga ejercicio con regularidad Haga ejercicio con regularidad. Esta es una de las prcticas ms importantes que puede hacer por su salud. La mayora de los adultos deben seguir estas pautas:  Realizar, al menos, 150minutos de actividad fsica por semana. El ejercicio debe aumentar la frecuencia cardaca y hacerlo transpirar (ejercicio de intensidad moderada).  Hacer ejercicios de fortalecimiento por lo menos dos veces por semana. Agregue esto a su plan de ejercicio de intensidad moderada.  Pasar menos tiempo sentados. Incluso la actividad fsica ligera puede ser beneficiosa. Controle sus niveles de colesterol y lpidos en la sangre Comience a realizarse anlisis de lpidos y colesterol en la sangre a los 20aos y luego reptalos cada 5aos. Hgase controlar los niveles de colesterol con mayor frecuencia si:  Sus niveles de lpidos y colesterol son altos.  Es mayor de 40aos.  Presenta un alto riesgo de padecer enfermedades cardacas. Qu debo saber sobre las pruebas de deteccin del cncer? Segn su historia clnica y sus antecedentes familiares, es posible que deba realizarse pruebas de deteccin del cncer en diferentes edades. Esto puede incluir pruebas de deteccin de lo siguiente:  Cncer de mama.  Cncer de cuello uterino.  Cncer colorrectal.  Cncer de piel.  Cncer de pulmn. Qu debo saber sobre la enfermedad cardaca, la diabetes y la hipertensin arterial? Presin arterial y enfermedad cardaca  La hipertensin arterial causa enfermedades cardacas y aumenta el riesgo de accidente cerebrovascular. Es ms probable que esto se manifieste en las personas que tienen lecturas de presin arterial alta, tienen ascendencia africana o   africana o tienen sobrepeso.  Hgase controlar la presin arterial: ? Cada 3 a 5 aos si tiene entre 18 y 39 aos. ? Todos los aos si es mayor de 40aos. Diabetes Realcese exmenes de deteccin de la diabetes con regularidad. Este  anlisis revisa el nivel de azcar en la sangre en ayunas. Hgase las pruebas de deteccin:  Cada tresaos despus de los 40aos de edad si tiene un peso normal y un bajo riesgo de padecer diabetes.  Con ms frecuencia y a partir de una edad inferior si tiene sobrepeso o un alto riesgo de padecer diabetes. Qu debo saber sobre la prevencin de infecciones? Hepatitis B Si tiene un riesgo ms alto de contraer hepatitis B, debe someterse a un examen de deteccin de este virus. Hable con el mdico para averiguar si tiene riesgo de contraer la infeccin por hepatitis B. Hepatitis C Se recomienda el anlisis a:  Todos los que nacieron entre 1945 y 1965.  Todas las personas que tengan un riesgo de haber contrado hepatitis C. Enfermedades de transmisin sexual (ETS)  Hgase las pruebas de deteccin de ITS, incluidas la gonorrea y la clamidia, si: ? Es sexualmente activa y es menor de 24aos. ? Es mayor de 24aos, y el mdico le informa que corre riesgo de tener este tipo de infecciones. ? La actividad sexual ha cambiado desde que le hicieron la ltima prueba de deteccin y tiene un riesgo mayor de tener clamidia o gonorrea. Pregntele al mdico si usted tiene riesgo.  Pregntele al mdico si usted tiene un alto riesgo de contraer VIH. El mdico tambin puede recomendarle un medicamento recetado para ayudar a evitar la infeccin por el VIH. Si elige tomar medicamentos para prevenir el VIH, primero debe hacerse los anlisis de deteccin del VIH. Luego debe hacerse anlisis cada 3meses mientras est tomando los medicamentos. Embarazo  Si est por dejar de menstruar (fase premenopusica) y usted puede quedar embarazada, busque asesoramiento antes de quedar embarazada.  Tome de 400 a 800microgramos (mcg) de cido flico todos los das si queda embarazada.  Pida mtodos de control de la natalidad (anticonceptivos) si desea evitar un embarazo no deseado. Osteoporosis y menopausia La  osteoporosis es una enfermedad en la que los huesos pierden los minerales y la fuerza por el avance de la edad. El resultado pueden ser fracturas en los huesos. Si tiene 65aos o ms, o si est en riesgo de sufrir osteoporosis y fracturas, pregunte a su mdico si debe:  Hacerse pruebas de deteccin de prdida sea.  Tomar un suplemento de calcio o de vitamina D para reducir el riesgo de fracturas.  Recibir terapia de reemplazo hormonal (TRH) para tratar los sntomas de la menopausia. Siga estas instrucciones en su casa: Estilo de vida  No consuma ningn producto que contenga nicotina o tabaco, como cigarrillos, cigarrillos electrnicos y tabaco de mascar. Si necesita ayuda para dejar de fumar, consulte al mdico.  No consuma drogas.  No comparta agujas.  Solicite ayuda a su mdico si necesita apoyo o informacin para abandonar las drogas. Consumo de alcohol  No beba alcohol si: ? Su mdico le indica no hacerlo. ? Est embarazada, puede estar embarazada o est tratando de quedar embarazada.  Si bebe alcohol: ? Limite la cantidad que consume de 0 a 1 medida por da. ? Limite la ingesta si est amamantando.  Est atento a la cantidad de alcohol que hay en las bebidas que toma. En [place], una medida equivale a una botella de cerveza de   un vaso de vino de 5oz (148ml) o un vaso de una bebida alcohlica de alta graduacin de 1oz (44ml). Instrucciones generales  Realcese los estudios de rutina de la salud, dentales y de la vista.  Mantngase al da con las vacunas.  Infrmele a su mdico si: ? Se siente deprimida con frecuencia. ? Alguna vez ha sido vctima de maltrato o no se siente segura en su casa. Resumen  Adoptar un estilo de vida saludable y recibir atencin preventiva son importantes para promover la salud y el bienestar.  Siga las instrucciones del mdico acerca de una dieta saludable, el ejercicio y la realizacin de pruebas o exmenes  para detectar enfermedades.  Siga las instrucciones del mdico con respecto al control del colesterol y la presin arterial. Esta informacin no tiene como fin reemplazar el consejo del mdico. Asegrese de hacerle al mdico cualquier pregunta que tenga. Document Revised: 05/12/2018 Document Reviewed: 05/12/2018 Elsevier Patient Education  2020 [employer]vier Inc[employer]  

## 2019-08-10 NOTE — Progress Notes (Signed)
4/7/20218:14 AM  Lisa Villarreal [birth year], 44 y.o., female [id number]  Chief Complaint  Patient presents with  . Annual Exam    CPE  . Ingrown Toenail    HPI:   Patient is a 44 y.o. female who presents today for CPE  Cervical Cancer Screening: oct 2020, obgyn Breast Cancer Screening: may 2020, obgyn Colorectal Cancer Screening: at age 23 Bone Density Testing: at age 6  Most Recent Immunizations  Administered Date(s) Administered  . Influenza Split 06/07/2012  . Influenza Whole 06/04/2009  . Influenza,inj,Quad PF,6+ Mos 03/01/2019  . Tdap 01/26/2015  completed covid vaccine series  Health Maintenance Due  Topic Date Due  . HIV Screening  Never done    Frequency of Dental evaluation: Q6 months Frequency of Eye evaluation: does not see eye doctor  Depression screen [facility] 2/9 06/23/2018 11/20/2017 04/23/2017  Decreased Interest 0 0 0  Down, Depressed, Hopeless 0 0 0  PHQ - 2 Score 0 0 0    Fall Risk  06/23/2018 11/20/2017 04/23/2017 03/19/2017 01/09/2017  Falls in the past year? 0 No No No No  Injury with Fall? 0 - - - -     Allergies  Allergen Reactions  . Penicillins     REACTION: Swelling    Prior to Admission medications   Medication Sig Start Date End Date Taking? Authorizing Provider  norethindrone-ethinyl estradiol (LOESTRIN FE) 1-20 MG-MCG tablet Take 1 tablet by mouth daily. 03/28/19  Yes Huel Cote, NP   Stopped taking vitamin D 1000 units about 2 months ago  Past Medical History:  Diagnosis Date  . Anemia   . Anxiety   . Headache, migraine    resolved, none for years  . Vitamin D deficiency     Past Surgical History:  Procedure Laterality Date  . CESAREAN SECTION      Social History   Tobacco Use  . Smoking status: Never Smoker  . Smokeless tobacco: Never Used  Substance Use Topics  . Alcohol use: No    Family History  Problem Relation Age of Onset  . Heart disease Mother        pacemarker in place  . Leukemia  Maternal Aunt     Review of Systems  Constitutional: Negative for chills and fever.  HENT: Negative for hearing loss.   Eyes: Negative for blurred vision and double vision.  Respiratory: Negative for cough and shortness of breath.   Cardiovascular: Negative for chest pain, palpitations and leg swelling.  Gastrointestinal: Negative for abdominal pain, blood in stool, constipation, diarrhea, melena, nausea and vomiting.  Genitourinary: Negative for frequency and urgency.  Musculoskeletal: Positive for back pain.  Skin:       Right painful great toenail only is she cuts it too short  Neurological: Negative for dizziness and headaches.  Endo/Heme/Allergies: Negative for polydipsia.  Psychiatric/Behavioral: Negative for depression. The patient is nervous/anxious. The patient does not have insomnia.   All other systems reviewed and are negative.    OBJECTIVE:  Today's Vitals   08/10/19 0801  BP: 123/70  Pulse: 77  Temp: 98.6 F (37 C)  TempSrc: Temporal  SpO2: 98%  Weight: 117 lb 3.2 oz (53.2 kg)  Height: 5' (1.524 m)   Body mass index is 22.89 kg/m.   Hearing Screening   125Hz  250Hz  500Hz  1000Hz  2000Hz  3000Hz  4000Hz  6000Hz  8000Hz   Right ear:           Left ear:  Visual Acuity Screening   Right eye Left eye Both eyes  Without correction: 20/25 20/40 20/25   With correction:       Physical Exam Vitals and nursing note reviewed.  Constitutional:      Appearance: She is well-developed.  HENT:     Head: Normocephalic and atraumatic.     Right Ear: Hearing, tympanic membrane, ear canal and external ear normal.     Left Ear: Hearing, tympanic membrane, ear canal and external ear normal.     Mouth/Throat:     Mouth: Mucous membranes are moist.     Pharynx: No oropharyngeal exudate or posterior oropharyngeal erythema.  Eyes:     Extraocular Movements: Extraocular movements intact.     Conjunctiva/sclera: Conjunctivae normal.     Pupils: Pupils are equal,  round, and reactive to light.  Neck:     Thyroid: No thyromegaly.  Cardiovascular:     Rate and Rhythm: Normal rate and regular rhythm.     Heart sounds: Normal heart sounds. No murmur. No friction rub. No gallop.   Pulmonary:     Effort: Pulmonary effort is normal.     Breath sounds: Normal breath sounds. No wheezing, rhonchi or rales.  Abdominal:     General: Bowel sounds are normal. There is no distension.     Palpations: Abdomen is soft. There is no hepatomegaly, splenomegaly or mass.     Tenderness: There is no abdominal tenderness.  Musculoskeletal:        General: Normal range of motion.     Cervical back: Neck supple.     Right lower leg: No edema.     Left lower leg: No edema.  Lymphadenopathy:     Cervical: No cervical adenopathy.  Skin:    General: Skin is warm and dry.  Neurological:     Mental Status: She is alert and oriented to person, place, and time.     Cranial Nerves: No cranial nerve deficit.     Gait: Gait normal.     Deep Tendon Reflexes: Reflexes are normal and symmetric.  Psychiatric:        Mood and Affect: Mood normal.        Behavior: Behavior normal.     No results found for this or any previous visit (from the past 24 hour(s)).  No results found.   ASSESSMENT and PLAN  1. Annual physical exam No concerns per history or exam. Routine HCM labs ordered. HCM reviewed/discussed. Anticipatory guidance regarding healthy weight, lifestyle and choices given.   2. Encounter for screening for HIV - HIV antibody  3. Screening for deficiency anemia H/o def anemia 2/2 menorrhagia which is now controlled on OCPs with low Fe - CBC  4. Screening for lipoid disorders - Lipid panel  5. Screening for endocrine, metabolic and immunity disorder - Comprehensive metabolic panel - TSH  6. Vitamin D deficiency - Vitamin D, 25-hydroxy  Return in 1 year (on 08/09/2020).    Rutherford Guys, MD [facility] [place], [place]  [id number] Ph.  [phone] Fax [phone]

## 2019-08-11 LAB — CBC
Hematocrit: 36.8 % (ref 34.0–46.6)
Hemoglobin: 11.9 g/dL (ref 11.1–15.9)
MCH: 24.6 pg — ABNORMAL LOW (ref 26.6–33.0)
MCHC: 32.3 g/dL (ref 31.5–35.7)
MCV: 76 fL — ABNORMAL LOW (ref 79–97)
Platelets: 306 10*3/uL (ref 150–450)
RBC: 4.83 x10E6/uL (ref 3.77–5.28)
RDW: 18 % — ABNORMAL HIGH (ref 11.7–15.4)
WBC: 5.1 10*3/uL (ref 3.4–10.8)

## 2019-08-11 LAB — COMPREHENSIVE METABOLIC PANEL
ALT: 15 IU/L (ref 0–32)
AST: 16 IU/L (ref 0–40)
Albumin/Globulin Ratio: 1.6 (ref 1.2–2.2)
Albumin: 4.3 g/dL (ref 3.8–4.8)
Alkaline Phosphatase: 50 IU/L (ref 39–117)
BUN/Creatinine Ratio: 15 (ref 9–23)
BUN: 12 mg/dL (ref 6–24)
Bilirubin Total: <0.2 mg/dL (ref 0.0–1.2)
CO2: 19 mmol/L — ABNORMAL LOW (ref 20–29)
Calcium: 9 mg/dL (ref 8.7–10.2)
Chloride: 104 mmol/L (ref 96–106)
Creatinine, Ser: 0.81 mg/dL (ref 0.57–1.00)
GFR calc Af Amer: 102 mL/min/{1.73_m2} (ref >59)
GFR calc non Af Amer: 89 mL/min/{1.73_m2} (ref >59)
Globulin, Total: 2.7 g/dL (ref 1.5–4.5)
Glucose: 100 mg/dL — ABNORMAL HIGH (ref 65–99)
Potassium: 3.9 mmol/L (ref 3.5–5.2)
Sodium: 139 mmol/L (ref 134–144)
Total Protein: 7 g/dL (ref 6.0–8.5)

## 2019-08-11 LAB — VITAMIN D 25 HYDROXY (VIT D DEFICIENCY, FRACTURES): Vit D, 25-Hydroxy: 31.6 ng/mL (ref 30.0–100.0)

## 2019-08-11 LAB — LIPID PANEL
Chol/HDL Ratio: 2.2 ratio (ref 0.0–4.4)
Cholesterol, Total: 153 mg/dL (ref 100–199)
HDL: 70 mg/dL (ref >39)
LDL Chol Calc (NIH): 63 mg/dL (ref 0–99)
Triglycerides: 111 mg/dL (ref 0–149)
VLDL Cholesterol Cal: 20 mg/dL (ref 5–40)

## 2019-08-11 LAB — TSH: TSH: 3.99 u[IU]/mL (ref 0.450–4.500)

## 2019-08-11 LAB — HIV ANTIBODY (ROUTINE TESTING W REFLEX): HIV Screen 4th Generation wRfx: NONREACTIVE

## 2019-10-18 ENCOUNTER — Other Ambulatory Visit: Payer: Self-pay | Admitting: Family Medicine

## 2019-10-18 DIAGNOSIS — Z1231 Encounter for screening mammogram for malignant neoplasm of breast: Secondary | ICD-10-CM

## 2019-11-08 ENCOUNTER — Other Ambulatory Visit: Payer: Self-pay

## 2019-11-08 ENCOUNTER — Ambulatory Visit
Admission: RE | Admit: 2019-11-08 | Discharge: 2019-11-08 | Disposition: A | Discharge status: Home / Self Care | Payer: 59 | Source: Ambulatory Visit | Attending: Family Medicine | Admitting: Family Medicine

## 2019-11-08 DIAGNOSIS — Z1231 Encounter for screening mammogram for malignant neoplasm of breast: Secondary | ICD-10-CM

## 2020-03-01 ENCOUNTER — Other Ambulatory Visit: Payer: Self-pay

## 2020-03-01 ENCOUNTER — Encounter: Payer: Self-pay | Admitting: Obstetrics & Gynecology

## 2020-03-01 ENCOUNTER — Ambulatory Visit (INDEPENDENT_AMBULATORY_CARE_PROVIDER_SITE_OTHER): Payer: 59 | Admitting: Obstetrics & Gynecology

## 2020-03-01 VITALS — BP 120/70 | Ht 58.5 in | Wt 117.0 lb

## 2020-03-01 DIAGNOSIS — Z01419 Encounter for gynecological examination (general) (routine) without abnormal findings: Secondary | ICD-10-CM

## 2020-03-01 DIAGNOSIS — N920 Excessive and frequent menstruation with regular cycle: Secondary | ICD-10-CM | POA: Diagnosis not present

## 2020-03-01 DIAGNOSIS — Z3041 Encounter for surveillance of contraceptive pills: Secondary | ICD-10-CM

## 2020-03-01 DIAGNOSIS — Z23 Encounter for immunization: Secondary | ICD-10-CM

## 2020-03-01 DIAGNOSIS — D219 Benign neoplasm of connective and other soft tissue, unspecified: Secondary | ICD-10-CM | POA: Diagnosis not present

## 2020-03-01 MED ORDER — NORETHIN ACE-ETH ESTRAD-FE 1-20 MG-MCG PO TABS: 1.0000 | ORAL_TABLET | Freq: Every day | ORAL | 4 refills | Status: DC

## 2020-03-01 NOTE — Progress Notes (Signed)
Lisa Villarreal - [place] 1975/10/26 [id number]   History:    44 y.o. [id number] Married.  Daughters 28, 16, 74 yo.  RP:  Established patient presenting for annual gyn exam   HPI: Well on the generic of LoEstrin Fe 1/20.  Light flow, no BTB.  Using to control menorrhagia and contraception. No pelvic pain.  No pain with IC.  Pap 02/2019 Neg. Urine/BMs normal.  Breasts normal. Screening mammo 11/2019 Negative.  BMI 24.04. Good fitness, cardio 5 times a week.  Healthy nutrition. Health labs with Fam MD.   Past medical history,surgical history, family history and social history were all reviewed and documented in the EPIC chart.  Gynecologic History Patient's last menstrual period was 02/27/2020.  Obstetric History OB History  Gravida Para Term Preterm AB Living  4 3 3  0 1 3  SAB TAB Ectopic Multiple Live Births  1 0 0 0      # Outcome Date GA Lbr Len/2nd Weight Sex Delivery Anes PTL Lv  4 Term 2005     Vag-Spont     3 Term 2003     Vag-Spont     2 Term 1999     CS-Classical     1 SAB              ROS: A ROS was performed and pertinent positives and negatives are included in the history.  GENERAL: No fevers or chills. HEENT: No change in vision, no earache, sore throat or sinus congestion. NECK: No pain or stiffness. CARDIOVASCULAR: No chest pain or pressure. No palpitations. PULMONARY: No shortness of breath, cough or wheeze. GASTROINTESTINAL: No abdominal pain, nausea, vomiting or diarrhea, melena or bright red blood per rectum. GENITOURINARY: No urinary frequency, urgency, hesitancy or dysuria. MUSCULOSKELETAL: No joint or muscle pain, no back pain, no recent trauma. DERMATOLOGIC: No rash, no itching, no lesions. ENDOCRINE: No polyuria, polydipsia, no heat or cold intolerance. No recent change in weight. HEMATOLOGICAL: No anemia or easy bruising or bleeding. NEUROLOGIC: No headache, seizures, numbness, tingling or weakness. PSYCHIATRIC: No depression, no loss of interest in normal  activity or change in sleep pattern.     Exam:   BP 120/70   Ht 4' 10.5" (1.486 m)   Wt 117 lb (53.1 kg)   LMP 02/27/2020   BMI 24.04 kg/m   Body mass index is 24.04 kg/m.  General appearance : Well developed well nourished female. No acute distress HEENT: Eyes: no retinal hemorrhage or exudates,  Neck supple, trachea midline, no carotid bruits, no thyroidmegaly Lungs: Clear to auscultation, no rhonchi or wheezes, or rib retractions  Heart: Regular rate and rhythm, no murmurs or gallops Breast:Examined in sitting and supine position were symmetrical in appearance, no palpable masses or tenderness,  no skin retraction, no nipple inversion, no nipple discharge, no skin discoloration, no axillary or supraclavicular lymphadenopathy Abdomen: no palpable masses or tenderness, no rebound or guarding Extremities: no edema or skin discoloration or tenderness  Pelvic: Vulva: Normal             Vagina: No gross lesions or discharge  Cervix: No gross lesions or discharge  Uterus  AV, mildly increased in size 9 cm, nodular, non-tender and mobile  Adnexa  Without masses or tenderness  Anus: Normal   Assessment/Plan:  44 y.o. female for annual exam   1. Encounter for routine gynecological examination with Papanicolaou smear of cervix Gynecologic exam with probable uterine fibroids.  Patient is a symptomatic from fibroids on the birth control pills.  Pap reflex done.  Breast exam normal.  Screening mammogram July 2021 -.  Good body mass index at 24.04.  Continue with fitness and healthy nutrition.  Health labs with family physician.  2. Encounter for surveillance of contraceptive pills Well on Loestrin FE 1/20 generic.  No contraindication to continue on birth control pills.  Prescription sent to pharmacy.  3. Menorrhagia with regular cycle Menorrhagia controlled on birth control pills.  Light menses with no breakthrough bleeding.  Nonetheless, patient prefers pelvic ultrasound to assess the  presence of fibroids. - US Transvaginal Non-OB; Future  4. Fibroids Asymptomatic uterine fibroids felt on exam.  Patient prefers assessment by pelvic ultrasound.  Follow-up pelvic ultrasound. - US Transvaginal Non-OB; Future  5. Flu vaccine need Flu shot received.  Other orders - Flu Vaccine QUAD 36+ mos IM (Fluarix, Quad PF) - norethindrone-ethinyl estradiol (LOESTRIN FE) 1-20 MG-MCG tablet; Take 1 tablet by mouth daily.  Princess Bruins MD, 4:12 PM 03/01/2020

## 2020-03-02 ENCOUNTER — Encounter: Payer: Self-pay | Admitting: Obstetrics & Gynecology

## 2020-03-22 ENCOUNTER — Ambulatory Visit: Payer: 59 | Admitting: Obstetrics & Gynecology

## 2020-03-22 ENCOUNTER — Other Ambulatory Visit: Payer: 59

## 2020-03-23 ENCOUNTER — Other Ambulatory Visit: Payer: Self-pay

## 2020-03-23 ENCOUNTER — Encounter: Payer: Self-pay | Admitting: Obstetrics & Gynecology

## 2020-03-23 ENCOUNTER — Ambulatory Visit (INDEPENDENT_AMBULATORY_CARE_PROVIDER_SITE_OTHER): Payer: 59 | Admitting: Obstetrics & Gynecology

## 2020-03-23 ENCOUNTER — Ambulatory Visit (INDEPENDENT_AMBULATORY_CARE_PROVIDER_SITE_OTHER): Payer: 59

## 2020-03-23 DIAGNOSIS — D251 Intramural leiomyoma of uterus: Secondary | ICD-10-CM

## 2020-03-23 DIAGNOSIS — D219 Benign neoplasm of connective and other soft tissue, unspecified: Secondary | ICD-10-CM

## 2020-03-23 DIAGNOSIS — N920 Excessive and frequent menstruation with regular cycle: Secondary | ICD-10-CM

## 2020-03-23 DIAGNOSIS — N854 Malposition of uterus: Secondary | ICD-10-CM

## 2020-03-28 ENCOUNTER — Encounter: Payer: Self-pay | Admitting: Obstetrics & Gynecology

## 2020-03-28 NOTE — Progress Notes (Signed)
    Lisa Villarreal 1975-10-03 [id number]        44 y.o.  [id number]   RP: Uterine Fibroids for Pelvic US  HPI: Menorrhagia controled on BCPs.  No BTB.  Fibroids felt on Gynecologic exam on 03/01/2020.  No pelvic pain.  Urine/BMs normal.   OB History  Gravida Para Term Preterm AB Living  4 3 3  0 1 3  SAB TAB Ectopic Multiple Live Births  1 0 0 0      # Outcome Date GA Lbr Len/2nd Weight Sex Delivery Anes PTL Lv  4 Term 2005     Vag-Spont     3 Term 2003     Vag-Spont     2 Term 1999     CS-Classical     1 SAB             Past medical history,surgical history, problem list, medications, allergies, family history and social history were all reviewed and documented in the EPIC chart.   Directed ROS with pertinent positives and negatives documented in the history of present illness/assessment and plan.  Exam:  There were no vitals filed for this visit. General appearance:  Normal  Pelvic US today: T/V images.  Anteverted enlarged uterus with several intramural fibroids, the largest is measured at 5 x 3 cm to the left.  The overall uterine size is measured at 9.24 x 5.87 x 5.61 cm.  The endometrial lining is measured at 4.24 mm.  Small amount of fluid in the endometrial cavity on cycle day #27 with smooth symmetrical walls.  No mass seen at the endometrium.  Right ovary is normal in size.  Left ovary is normal in size with a single 1.1 cm follicle.  No adnexal mass seen.  No free fluid in the posterior cul-de-sac.   Assessment/Plan:  44 y.o. [id number]   1. Fibroids Pelvic ultrasound findings thoroughly reviewed with patient.  Several intramural fibroids are present, the largest is measured at 5 x 3 cm to the left.  Normal endometrial lining at 4.2 mm.  Normal ovaries.  Patient reassured.  Heavy menses controlled on birth control pills.  No pelvic pain.  Decision to observe the fibroids.  2. Menorrhagia with regular cycle Controlled on birth control pills.  No contraindication  to continue on the pill.  Princess Bruins MD, 9:48 PM 03/28/2020

## 2020-04-16 ENCOUNTER — Other Ambulatory Visit: Payer: Self-pay

## 2020-04-16 ENCOUNTER — Telehealth (INDEPENDENT_AMBULATORY_CARE_PROVIDER_SITE_OTHER): Payer: 59 | Admitting: Family Medicine

## 2020-04-16 ENCOUNTER — Encounter: Payer: Self-pay | Admitting: Family Medicine

## 2020-04-16 DIAGNOSIS — J029 Acute pharyngitis, unspecified: Secondary | ICD-10-CM | POA: Diagnosis not present

## 2020-04-16 DIAGNOSIS — B309 Viral conjunctivitis, unspecified: Secondary | ICD-10-CM | POA: Diagnosis not present

## 2020-04-16 MED ORDER — MUCINEX DM MAXIMUM STRENGTH 60-1200 MG PO TB12: 1.0000 | ORAL_TABLET | Freq: Two times a day (BID) | ORAL | 1 refills | Status: DC

## 2020-04-16 MED ORDER — BENZONATATE 100 MG PO CAPS: 100.0000 mg | ORAL_CAPSULE | Freq: Three times a day (TID) | ORAL | 0 refills | Status: DC | PRN

## 2020-04-16 MED ORDER — GUAIFENESIN-CODEINE 100-10 MG/5ML PO SOLN: 5.0000 mL | Freq: Every evening | ORAL | 0 refills | Status: AC | PRN

## 2020-04-16 MED ORDER — EPINASTINE HCL 0.05 % OP SOLN: 1.0000 [drp] | Freq: Two times a day (BID) | OPHTHALMIC | 0 refills | Status: AC

## 2020-04-16 NOTE — Patient Instructions (Signed)
Viral Respiratory Infection A viral respiratory infection is an illness that affects parts of the body that are used for breathing. These include the lungs, nose, and throat. It is caused by a germ called a virus. Some examples of this kind of infection are:  A cold.  The flu (influenza).  A respiratory syncytial virus (RSV) infection. A person who gets this illness may have the following symptoms:  A stuffy or runny nose.  Yellow or green fluid in the nose.  A cough.  Sneezing.  Tiredness (fatigue).  Achy muscles.  A sore throat.  Sweating or chills.  A fever.  A headache. Follow these instructions at home: Managing pain and congestion  Take over-the-counter and prescription medicines only as told by your doctor.  If you have a sore throat, gargle with salt water. Do this 3-4 times per day or as needed. To make a salt-water mixture, dissolve -1 tsp of salt in 1 cup of warm water. Make sure that all the salt dissolves.  Use nose drops made from salt water. This helps with stuffiness (congestion). It also helps soften the skin around your nose.  Drink enough fluid to keep your pee (urine) pale yellow. General instructions   Rest as much as possible.  Do not drink alcohol.  Do not use any products that have nicotine or tobacco, such as cigarettes and e-cigarettes. If you need help quitting, ask your doctor.  Keep all follow-up visits as told by your doctor. This is important. How is this prevented?   Get a flu shot every year. Ask your doctor when you should get your flu shot.  Do not let other people get your germs. If you are sick: ? Stay home from work or school. ? Wash your hands with soap and water often. Wash your hands after you cough or sneeze. If soap and water are not available, use hand sanitizer.  Avoid contact with people who are sick during cold and flu season. This is in fall and winter. Get help if:  Your symptoms last for 10 days or  longer.  Your symptoms get worse over time.  You have a fever.  You have very bad pain in your face or forehead.  Parts of your jaw or neck become very swollen. Get help right away if:  You feel pain or pressure in your chest.  You have shortness of breath.  You faint or feel like you will faint.  You keep throwing up (vomiting).  You feel confused. Summary  A viral respiratory infection is an illness that affects parts of the body that are used for breathing.  Examples of this illness include a cold, the flu, and respiratory syncytial virus (RSV) infection.  The infection can cause a runny nose, cough, sneezing, sore throat, and fever.  Follow what your doctor tells you about taking medicines, drinking lots of fluid, washing your hands, resting at home, and avoiding people who are sick. This information is not intended to replace advice given to you by your health care provider. Make sure you discuss any questions you have with your health care provider. Document Revised: 04/29/2018 Document Reviewed: 06/01/2017 Elsevier Patient Education  2020 [employer].

## 2020-04-16 NOTE — Progress Notes (Signed)
Virtual Visit Note  I connected with patient on 04/16/20 at 1400 telephone due to unable to work Epic video visit and verified that I am speaking with the correct person using two identifiers. Lisa Villarreal is currently located at home and no family members are currently with them during visit. The provider, Laurita Quint Kesleigh Morson, FNP is located in their office at time of visit.  I discussed the limitations, risks, security and privacy concerns of performing an evaluation and management service by telephone and the availability of in person appointments. I also discussed with the patient that there may be a patient responsible charge related to this service. The patient expressed understanding and agreed to proceed.   I provided 20 minutes of non-face-to-face time during this encounter.  Chief Complaint  Patient presents with  . Fatigue    Body aches reports having had 3 rd booster vaccine on 04/09/20  . Cough    Laryngitis, sore and itchy throat, headache- taken cough syrup, allergy medicine, and tylenol      HPI ? Covid vaccine Last Monday (booster) Post vaccine had a sore throat and body aches 2 days after aches were gone, but sore throat has been worse Now having stuffy nose Has tried cold medicine and allergy medicine Neither medication seems to be working Has been using hot teas Not sleeping well due to cough  Symptoms seem to be improving    Allergies  Allergen Reactions  . Penicillins     REACTION: Swelling    Prior to Admission medications   Medication Sig Start Date End Date Taking? Authorizing Provider  norethindrone-ethinyl estradiol (LOESTRIN FE) 1-20 MG-MCG tablet Take 1 tablet by mouth daily. 03/01/20  Yes Princess Bruins, MD    Past Medical History:  Diagnosis Date  . Anemia   . Anxiety   . Headache, migraine    resolved, none for years  . Vitamin D deficiency     Past Surgical History:  Procedure Laterality Date  . CESAREAN SECTION       Social History   Tobacco Use  . Smoking status: Never Smoker  . Smokeless tobacco: Never Used  Substance Use Topics  . Alcohol use: No    Family History  Problem Relation Age of Onset  . Heart disease Mother        pacemarker in place  . Leukemia Maternal Aunt     Review of Systems  Constitutional: Positive for malaise/fatigue. Negative for chills and fever.  HENT: Positive for congestion and sore throat. Negative for sinus pain.   Eyes: Positive for redness. Negative for pain and discharge.  Respiratory: Positive for cough and sputum production (greenish). Negative for shortness of breath and wheezing.   Cardiovascular: Negative for chest pain.  Gastrointestinal: Positive for diarrhea (only on Tuesday). Negative for abdominal pain, constipation, nausea and vomiting.  Neurological: Positive for headaches.    Objective  Constitutional:      General: She is not in acute distress.    Appearance: Normal appearance. She is not ill-appearing.   Pulmonary:     Effort: Pulmonary effort is normal. No respiratory distress.  Neurological:     Mental Status: She is alert and oriented to person, place, and time.  Psychiatric:        Mood and Affect: Mood normal.        Behavior: Behavior normal.     ASSESSMENT and PLAN  Problem List Items Addressed This Visit   None   Visit Diagnoses  Sore throat    -  Primary   Relevant Medications   Dextromethorphan-guaiFENesin (MUCINEX DM MAXIMUM STRENGTH) 60-1200 MG TB12   benzonatate (TESSALON) 100 MG capsule   guaiFENesin-codeine 100-10 MG/5ML syrup   Viral conjunctivitis of left eye       Relevant Medications   Epinastine HCl 0.05 % ophthalmic solution  RTC precautions provided R/se/b of medications discussed Discussed conservative treatment:  sore throat, gargle with salt water. Do this 3-4 times per day or as needed. To make a salt-water mixture, dissolve -1 tsp of salt in 1 cup of warm water. Make sure that all the  salt dissolves.  Use nose drops made from salt water. This helps with stuffiness (congestion). It also helps soften the skin around your nose.  Drink enough fluid to keep your pee (urine) pale yellow.  Rest       Return if symptoms worsen or fail to improve.   The above assessment and management plan was discussed with the patient. The patient verbalized understanding of and has agreed to the management plan. Patient is aware to call the clinic if symptoms persist or worsen. Patient is aware when to return to the clinic for a follow-up visit. Patient educated on when it is appropriate to go to the emergency department.     Lisa Foley Brizeida Mcmurry, FNP-BC Primary Care at [facility] [place], [place] [address] Ph.  [phone] Fax [phone]

## 2020-08-13 ENCOUNTER — Encounter: Payer: 59 | Admitting: Family Medicine

## 2020-10-05 ENCOUNTER — Other Ambulatory Visit: Payer: Self-pay | Admitting: Internal Medicine

## 2020-10-05 DIAGNOSIS — Z1231 Encounter for screening mammogram for malignant neoplasm of breast: Secondary | ICD-10-CM

## 2020-11-29 ENCOUNTER — Other Ambulatory Visit: Payer: Self-pay

## 2020-11-29 ENCOUNTER — Ambulatory Visit
Admission: RE | Admit: 2020-11-29 | Discharge: 2020-11-29 | Disposition: A | Discharge status: Home / Self Care | Payer: 59 | Source: Ambulatory Visit | Attending: Internal Medicine | Admitting: Internal Medicine

## 2020-11-29 DIAGNOSIS — Z1231 Encounter for screening mammogram for malignant neoplasm of breast: Secondary | ICD-10-CM

## 2021-01-01 ENCOUNTER — Other Ambulatory Visit: Payer: Self-pay

## 2021-01-01 ENCOUNTER — Ambulatory Visit (INDEPENDENT_AMBULATORY_CARE_PROVIDER_SITE_OTHER): Payer: 59 | Admitting: Internal Medicine

## 2021-01-01 ENCOUNTER — Encounter: Payer: Self-pay | Admitting: Internal Medicine

## 2021-01-01 VITALS — BP 110/70 | HR 75 | Temp 98.1°F | Ht 59.0 in | Wt 123.8 lb

## 2021-01-01 DIAGNOSIS — Z0001 Encounter for general adult medical examination with abnormal findings: Secondary | ICD-10-CM | POA: Diagnosis not present

## 2021-01-01 DIAGNOSIS — M546 Pain in thoracic spine: Secondary | ICD-10-CM | POA: Diagnosis not present

## 2021-01-01 DIAGNOSIS — E559 Vitamin D deficiency, unspecified: Secondary | ICD-10-CM

## 2021-01-01 DIAGNOSIS — Z0000 Encounter for general adult medical examination without abnormal findings: Secondary | ICD-10-CM

## 2021-01-01 DIAGNOSIS — D509 Iron deficiency anemia, unspecified: Secondary | ICD-10-CM | POA: Diagnosis not present

## 2021-01-01 DIAGNOSIS — Z1211 Encounter for screening for malignant neoplasm of colon: Secondary | ICD-10-CM

## 2021-01-01 NOTE — Addendum Note (Signed)
Addended by: Westley Hummer B on: 01/01/2021 04:11 PM   Modules accepted: Orders

## 2021-01-01 NOTE — Progress Notes (Signed)
New Patient Office Visit     This visit occurred during the SARS-CoV-2 public health emergency.  Safety protocols were in place, including screening questions prior to the visit, additional usage of staff PPE, and extensive cleaning of exam room while observing appropriate contact time as indicated for disinfecting solutions.    CC/Reason for Visit: Establish care, annual preventive exam Previous PCP: Grant Fontana, MD Last Visit: April 2021  HPI: Lisa Villarreal is a 45 y.o. female who is coming in today for the above mentioned reasons. Past Medical History is significant for: Vitamin D deficiency and iron deficiency anemia.  She does not smoke, she does not drink, she has allergies to penicillin which cause hives.  She has had 1 C-section.  Her mother has coronary artery disease.  She prefers to speak in Spanish although she does understand English quite well.  She is overdue for her annual screening colonoscopy, she had a negative mammogram in September.  She has a follow-up with her GYN in October.  She has had all 3 COVID vaccines, she prefers to get her flu vaccine around October.  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  For the past few years she has had intermittent left upper back pain.  She states that certain body positions will exacerbate it.  The pain will last for a few hours to days before it abates.  She gets very concerned because she thinks she might have lung cancer.  This causes extreme anxiety.  She is requesting x-ray or CT scan.  She does not have shortness of breath or cough, she is a never smoker.   Past Medical/Surgical History: Past Medical History:  Diagnosis Date   Anemia    Anxiety    Headache, migraine    resolved, none for years   Vitamin D deficiency     Past Surgical History:  Procedure Laterality Date   CESAREAN SECTION      Social History:  reports that she has never smoked. She has never used smokeless tobacco. She reports that  she does not drink alcohol and does not use drugs.  Allergies: Allergies  Allergen Reactions   Penicillins     REACTION: Swelling    Family History:  Family History  Problem Relation Age of Onset   Heart disease Mother        pacemarker in place   Leukemia Maternal Aunt      Current Outpatient Medications:    norethindrone-ethinyl estradiol (LOESTRIN FE) 1-20 MG-MCG tablet, Take 1 tablet by mouth daily., Disp: 84 tablet, Rfl: 4  Review of Systems:  Constitutional: Denies fever, chills, diaphoresis, appetite change and fatigue.  HEENT: Denies photophobia, eye pain, redness, hearing loss, ear pain, congestion, sore throat, rhinorrhea, sneezing, mouth sores, trouble swallowing, neck pain, neck stiffness and tinnitus.   Respiratory: Denies SOB, DOE, cough, chest tightness,  and wheezing.   Cardiovascular: Denies chest pain, palpitations and leg swelling.  Gastrointestinal: Denies nausea, vomiting, abdominal pain, diarrhea, constipation, blood in stool and abdominal distention.  Genitourinary: Denies dysuria, urgency, frequency, hematuria, flank pain and difficulty urinating.  Endocrine: Denies: hot or cold intolerance, sweats, changes in hair or nails, polyuria, polydipsia. Musculoskeletal: Denies myalgias,  joint swelling, arthralgias and gait problem.  Skin: Denies pallor, rash and wound.  Neurological: Denies dizziness, seizures, syncope, weakness, light-headedness, numbness and headaches.  Hematological: Denies adenopathy. Easy bruising, personal or family bleeding history  Psychiatric/Behavioral: Denies suicidal ideation, mood changes, confusion, nervousness, sleep disturbance and agitation  Physical Exam: Vitals:   01/01/21 1523  BP: 110/70  Pulse: 75  Temp: 98.1 F (36.7 C)  TempSrc: Oral  SpO2: 99%  Weight: 123 lb 12.8 oz (56.2 kg)  Height: '4\' 11"'$  (1.499 m)   Body mass index is 25 kg/m.  Constitutional: NAD, calm, comfortable Eyes: PERRL, lids and  conjunctivae normal ENMT: Mucous membranes are moist. Posterior pharynx clear of any exudate or lesions. Normal dentition. Tympanic membrane is pearly white, no erythema or bulging. Neck: normal, supple, no masses, no thyromegaly Respiratory: clear to auscultation bilaterally, no wheezing, no crackles. Normal respiratory effort. No accessory muscle use.  Cardiovascular: Regular rate and rhythm, no murmurs / rubs / gallops. No extremity edema. 2+ pedal pulses. No carotid bruits.  Abdomen: no tenderness, no masses palpated. No hepatosplenomegaly. Bowel sounds positive.  Musculoskeletal: no clubbing / cyanosis. No joint deformity upper and lower extremities. Good ROM, no contractures. Normal muscle tone.  Skin: no rashes, lesions, ulcers. No induration Neurologic: CN 2-12 grossly intact. Sensation intact, DTR normal. Strength 5/5 in all 4.  Psychiatric: Normal judgment and insight. Alert and oriented x 3. Normal mood.    Impression and Plan:  Encounter for preventive health examination  -Advised routine eye and dental care. -She is due for flu vaccine but prefers to get it around October. -Screening labs today. -Healthy lifestyle discussed in detail. -Refer to GI for initial screening colonoscopy. -She had a negative mammogram in July. -She follows with her GYN in October.  She does not believe she is due for Pap smear this year.  Iron deficiency anemia, unspecified iron deficiency anemia type  -Check CBC.  Vitamin D deficiency  - Plan: VITAMIN D 25 Hydroxy (Vit-D Deficiency, Fractures)  Acute left-sided thoracic back pain -Based on lung auscultation and physical exam findings, I do not believe that imaging is necessary at this time. -This is likely musculoskeletal in origin. -Have advised back stretches, icing, as needed ibuprofen, can consider PT if recurrent.    Patient Instructions  -Vuelva en ayunas para sus pruebas de laboratorio.  -Referimiento a GI para su  colonoscopia.  -Para la espalda: ibuprofen de ser necesario, masajes, ejercicios de estiramiento diarios.  -Regrese en un ao, antes de ser necesario.      Lelon Frohlich, MD [facility]

## 2021-01-01 NOTE — Patient Instructions (Signed)
-  Vuelva en ayunas para sus pruebas de laboratorio.  -Referimiento a GI para su colonoscopia.  -Para la espalda: ibuprofen de ser necesario, masajes, ejercicios de estiramiento diarios.  -Regrese en un ao, antes de ser necesario.

## 2021-01-02 ENCOUNTER — Other Ambulatory Visit: Payer: Self-pay | Admitting: Internal Medicine

## 2021-01-02 ENCOUNTER — Other Ambulatory Visit (INDEPENDENT_AMBULATORY_CARE_PROVIDER_SITE_OTHER): Payer: 59

## 2021-01-02 DIAGNOSIS — Z0000 Encounter for general adult medical examination without abnormal findings: Secondary | ICD-10-CM | POA: Diagnosis not present

## 2021-01-02 DIAGNOSIS — D509 Iron deficiency anemia, unspecified: Secondary | ICD-10-CM | POA: Diagnosis not present

## 2021-01-02 DIAGNOSIS — E559 Vitamin D deficiency, unspecified: Secondary | ICD-10-CM

## 2021-01-02 LAB — LIPID PANEL
Cholesterol: 136 mg/dL (ref 0–200)
HDL: 52.6 mg/dL (ref >39.00)
LDL Cholesterol: 60 mg/dL (ref 0–99)
NonHDL: 83.08
Total CHOL/HDL Ratio: 3
Triglycerides: 116 mg/dL (ref 0.0–149.0)
VLDL: 23.2 mg/dL (ref 0.0–40.0)

## 2021-01-02 LAB — CBC WITH DIFFERENTIAL/PLATELET
Basophils Absolute: 0 10*3/uL (ref 0.0–0.1)
Basophils Relative: 0.6 % (ref 0.0–3.0)
Eosinophils Absolute: 0.1 10*3/uL (ref 0.0–0.7)
Eosinophils Relative: 1.9 % (ref 0.0–5.0)
HCT: 37.9 % (ref 36.0–46.0)
Hemoglobin: 12.7 g/dL (ref 12.0–15.0)
Lymphocytes Relative: 26.5 % (ref 12.0–46.0)
Lymphs Abs: 1.9 10*3/uL (ref 0.7–4.0)
MCHC: 33.6 g/dL (ref 30.0–36.0)
MCV: 83.2 fl (ref 78.0–100.0)
Monocytes Absolute: 0.4 10*3/uL (ref 0.1–1.0)
Monocytes Relative: 6 % (ref 3.0–12.0)
Neutro Abs: 4.6 10*3/uL (ref 1.4–7.7)
Neutrophils Relative %: 65 % (ref 43.0–77.0)
Platelets: 314 10*3/uL (ref 150.0–400.0)
RBC: 4.56 Mil/uL (ref 3.87–5.11)
RDW: 13 % (ref 11.5–15.5)
WBC: 7.1 10*3/uL (ref 4.0–10.5)

## 2021-01-02 LAB — COMPREHENSIVE METABOLIC PANEL
ALT: 12 U/L (ref 0–35)
AST: 13 U/L (ref 0–37)
Albumin: 4 g/dL (ref 3.5–5.2)
Alkaline Phosphatase: 41 U/L (ref 39–117)
BUN: 12 mg/dL (ref 6–23)
CO2: 25 mEq/L (ref 19–32)
Calcium: 8.8 mg/dL (ref 8.4–10.5)
Chloride: 104 mEq/L (ref 96–112)
Creatinine, Ser: 0.82 mg/dL (ref 0.40–1.20)
GFR: 85.99 mL/min (ref >60.00)
Glucose, Bld: 92 mg/dL (ref 70–99)
Potassium: 4.1 mEq/L (ref 3.5–5.1)
Sodium: 136 mEq/L (ref 135–145)
Total Bilirubin: 0.5 mg/dL (ref 0.2–1.2)
Total Protein: 6.8 g/dL (ref 6.0–8.3)

## 2021-01-02 LAB — VITAMIN B12: Vitamin B-12: 377 pg/mL (ref 211–911)

## 2021-01-02 LAB — TSH: TSH: 4.33 u[IU]/mL (ref 0.35–5.50)

## 2021-01-02 LAB — VITAMIN D 25 HYDROXY (VIT D DEFICIENCY, FRACTURES): VITD: 28.39 ng/mL — ABNORMAL LOW (ref 30.00–100.00)

## 2021-01-02 LAB — HEMOGLOBIN A1C: Hgb A1c MFr Bld: 5.8 % (ref 4.6–6.5)

## 2021-01-02 MED ORDER — VITAMIN D (ERGOCALCIFEROL) 1.25 MG (50000 UNIT) PO CAPS: 50000.0000 [IU] | ORAL_CAPSULE | ORAL | 0 refills | Status: AC

## 2021-01-03 ENCOUNTER — Other Ambulatory Visit: Payer: Self-pay | Admitting: *Deleted

## 2021-01-03 DIAGNOSIS — E559 Vitamin D deficiency, unspecified: Secondary | ICD-10-CM

## 2021-02-18 ENCOUNTER — Encounter: Payer: Self-pay | Admitting: Internal Medicine

## 2021-02-19 ENCOUNTER — Encounter: Payer: Self-pay | Admitting: Gastroenterology

## 2021-02-22 ENCOUNTER — Ambulatory Visit: Payer: 59 | Admitting: Internal Medicine

## 2021-03-04 ENCOUNTER — Ambulatory Visit: Payer: 59 | Admitting: Obstetrics & Gynecology

## 2021-03-06 ENCOUNTER — Other Ambulatory Visit: Payer: Self-pay

## 2021-03-06 ENCOUNTER — Other Ambulatory Visit (HOSPITAL_COMMUNITY)
Admission: RE | Admit: 2021-03-06 | Discharge: 2021-03-06 | Disposition: A | Discharge status: Home / Self Care | Payer: 59 | Source: Ambulatory Visit | Attending: Obstetrics & Gynecology | Admitting: Obstetrics & Gynecology

## 2021-03-06 ENCOUNTER — Encounter: Payer: Self-pay | Admitting: Obstetrics & Gynecology

## 2021-03-06 ENCOUNTER — Ambulatory Visit (INDEPENDENT_AMBULATORY_CARE_PROVIDER_SITE_OTHER): Payer: 59 | Admitting: Obstetrics & Gynecology

## 2021-03-06 VITALS — BP 120/76 | HR 88 | Resp 16 | Ht 58.25 in | Wt 121.0 lb

## 2021-03-06 DIAGNOSIS — N914 Secondary oligomenorrhea: Secondary | ICD-10-CM | POA: Diagnosis not present

## 2021-03-06 DIAGNOSIS — Z01419 Encounter for gynecological examination (general) (routine) without abnormal findings: Secondary | ICD-10-CM

## 2021-03-06 DIAGNOSIS — D219 Benign neoplasm of connective and other soft tissue, unspecified: Secondary | ICD-10-CM | POA: Diagnosis not present

## 2021-03-06 DIAGNOSIS — Z23 Encounter for immunization: Secondary | ICD-10-CM

## 2021-03-06 DIAGNOSIS — Z3041 Encounter for surveillance of contraceptive pills: Secondary | ICD-10-CM | POA: Diagnosis not present

## 2021-03-06 LAB — PREGNANCY, URINE: Preg Test, Ur: NEGATIVE

## 2021-03-06 MED ORDER — NORETHIN ACE-ETH ESTRAD-FE 1-20 MG-MCG PO TABS: 1.0000 | ORAL_TABLET | Freq: Every day | ORAL | 4 refills | Status: DC

## 2021-03-06 NOTE — Progress Notes (Signed)
Lisa Villarreal 05-23-1975 [id number]   History:    45 y.o. [id number] Married.  Daughters 4, 30, 39 yo.   RP:  Established patient presenting for annual gyn exam    HPI: Well on the generic of LoEstrin Fe 1/20.  Light flow, no BTB.  Using to control menorrhagia and contraception. No pelvic pain.  No pain with IC, using condoms.  Pap 02/2019 Neg. Urine/BMs normal.  Breasts normal. Screening mammo 11/2020 Negative.  BMI 25.07. Good fitness, cardio 5 times a week.  Healthy nutrition. Health labs with Fam MD.    Past medical history,surgical history, family history and social history were all reviewed and documented in the EPIC chart.  Gynecologic History Patient's last menstrual period was 01/20/2021 (exact date).  Obstetric History OB History  Gravida Para Term Preterm AB Living  4 3 3  0 1 3  SAB IAB Ectopic Multiple Live Births  1 0 0 0      # Outcome Date GA Lbr Len/2nd Weight Sex Delivery Anes PTL Lv  4 Term 2005     Vag-Spont     3 Term 2003     Vag-Spont     2 Term 1999     CS-Classical     1 SAB              ROS: A ROS was performed and pertinent positives and negatives are included in the history.  GENERAL: No fevers or chills. HEENT: No change in vision, no earache, sore throat or sinus congestion. NECK: No pain or stiffness. CARDIOVASCULAR: No chest pain or pressure. No palpitations. PULMONARY: No shortness of breath, cough or wheeze. GASTROINTESTINAL: No abdominal pain, nausea, vomiting or diarrhea, melena or bright red blood per rectum. GENITOURINARY: No urinary frequency, urgency, hesitancy or dysuria. MUSCULOSKELETAL: No joint or muscle pain, no back pain, no recent trauma. DERMATOLOGIC: No rash, no itching, no lesions. ENDOCRINE: No polyuria, polydipsia, no heat or cold intolerance. No recent change in weight. HEMATOLOGICAL: No anemia or easy bruising or bleeding. NEUROLOGIC: No headache, seizures, numbness, tingling or weakness. PSYCHIATRIC: No depression, no  loss of interest in normal activity or change in sleep pattern.     Exam:   BP 120/76   Pulse 88   Resp 16   Ht 4' 10.25" (1.48 m)   Wt 121 lb (54.9 kg)   LMP 01/20/2021 (Exact Date) Comment: no cycle for october  BMI 25.07 kg/m   Body mass index is 25.07 kg/m.  General appearance : Well developed well nourished female. No acute distress HEENT: Eyes: no retinal hemorrhage or exudates,  Neck supple, trachea midline, no carotid bruits, no thyroidmegaly Lungs: Clear to auscultation, no rhonchi or wheezes, or rib retractions  Heart: Regular rate and rhythm, no murmurs or gallops Breast:Examined in sitting and supine position were symmetrical in appearance, no palpable masses or tenderness,  no skin retraction, no nipple inversion, no nipple discharge, no skin discoloration, no axillary or supraclavicular lymphadenopathy Abdomen: no palpable masses or tenderness, no rebound or guarding Extremities: no edema or skin discoloration or tenderness  Pelvic: Vulva: Normal             Vagina: No gross lesions or discharge  Cervix: No gross lesions or discharge.  Pap reflex done.  Uterus  AV, normal size, nodular, non-tender and mobile  Adnexa  Without masses or tenderness  Anus: Normal   Assessment/Plan:  45 y.o. female for annual exam   1. Encounter for routine gynecological examination with  Papanicolaou smear of cervix Gynecologic exam with normal size but nodular uterus corresponding to fibroids.  Pap reflex done today.  Breast exam normal.  Screening mammogram July 2022 was negative.  Health labs with family physician.  Good body mass and asked at 25.07.  Continue with fitness and healthy nutrition. - Cytology - PAP( [place])  2. Encounter for surveillance of contraceptive pills Well on the generic of Loestrin FE 1/20.  No contraindication to continue.  Prescription sent to pharmacy.  3. Fibroids F/U Pelvic US to confirm stability.  4. Secondary oligomenorrhea on birth  control pills Last intercourse was 2 weeks ago.  Patient always using condoms.  UPT Neg - Pregnancy, urine  Other orders - norethindrone-ethinyl estradiol-FE (LOESTRIN FE) 1-20 MG-MCG tablet; Take 1 tablet by mouth daily.   Princess Bruins MD, 4:55 PM 03/06/2021

## 2021-03-07 ENCOUNTER — Other Ambulatory Visit: Payer: Self-pay

## 2021-03-07 DIAGNOSIS — D219 Benign neoplasm of connective and other soft tissue, unspecified: Secondary | ICD-10-CM

## 2021-03-12 LAB — CYTOLOGY - PAP: Diagnosis: NEGATIVE

## 2021-03-13 IMAGING — MG DIGITAL SCREENING BILATERAL MAMMOGRAM WITH TOMO AND CAD
8 series · 9 of 24 positions shown · non-contrast
Comparison: Previous exam(s).

CLINICAL DATA: Screening.

EXAM:
DIGITAL SCREENING BILATERAL MAMMOGRAM WITH TOMO AND CAD

[R MLO synth-2D]
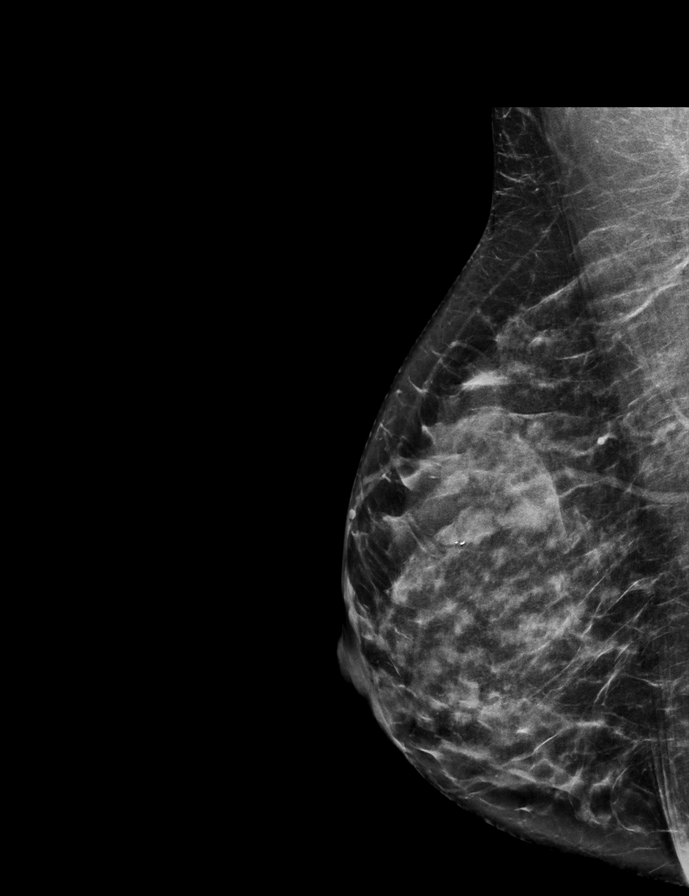

[L MLO synth-2D]
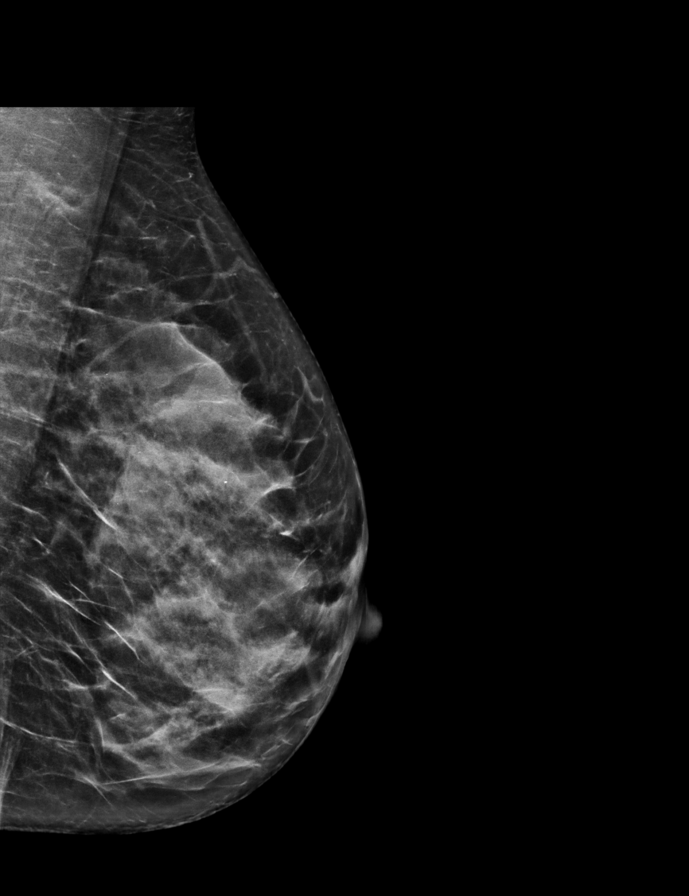

[R CC synth-2D]
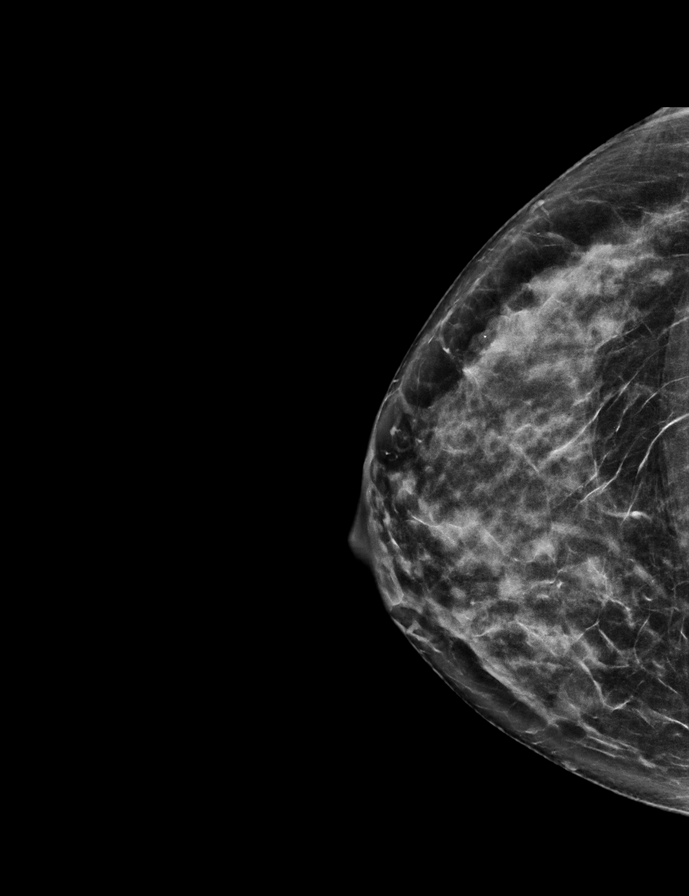

[L CC synth-2D]
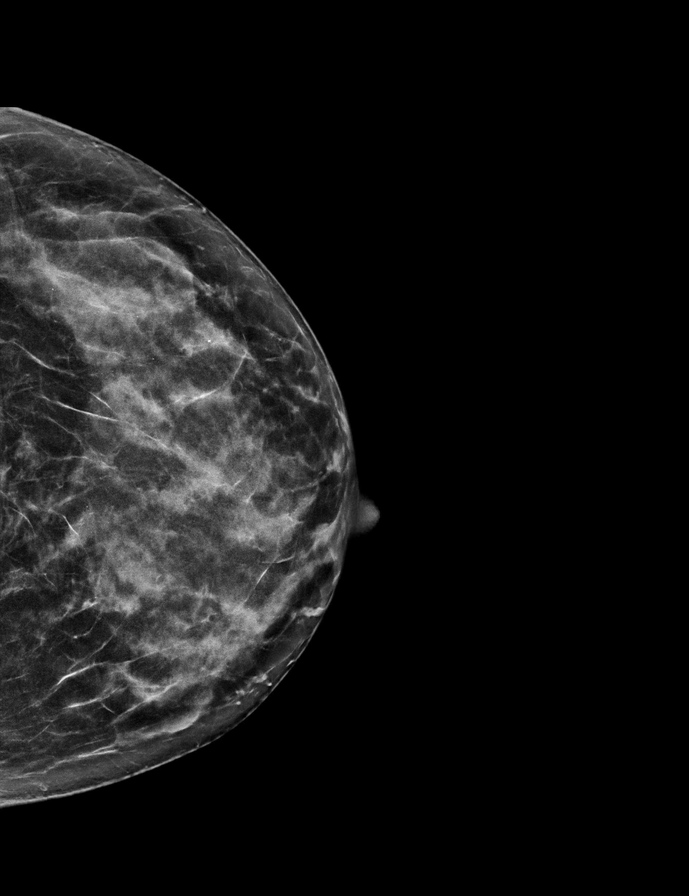

[R MLO tomo · 2 of 62 frames shown]
[frame 21/62]
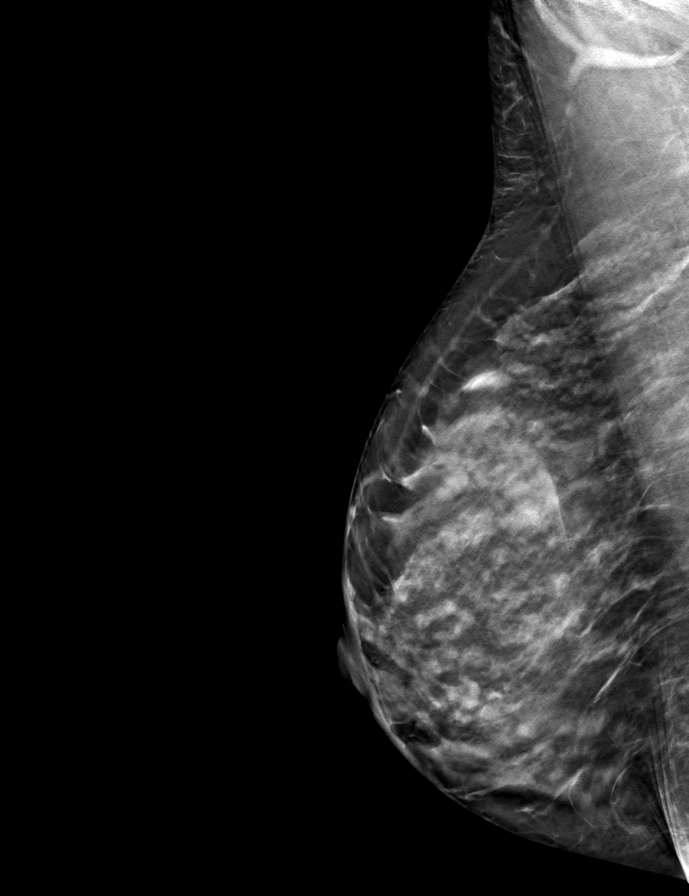
[frame 31/62]
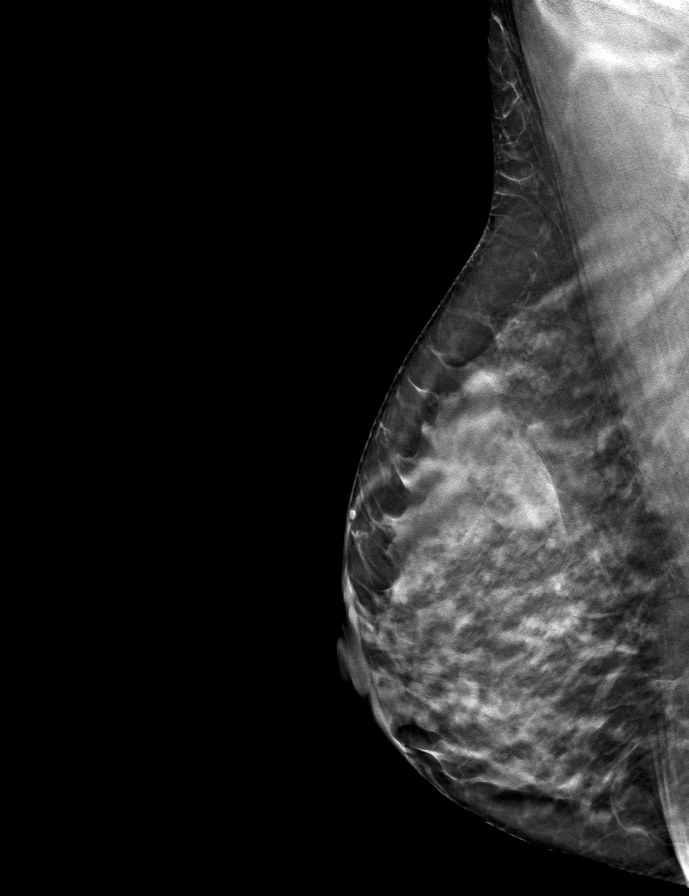

[R CC tomo · tomo slice 31/60.0]
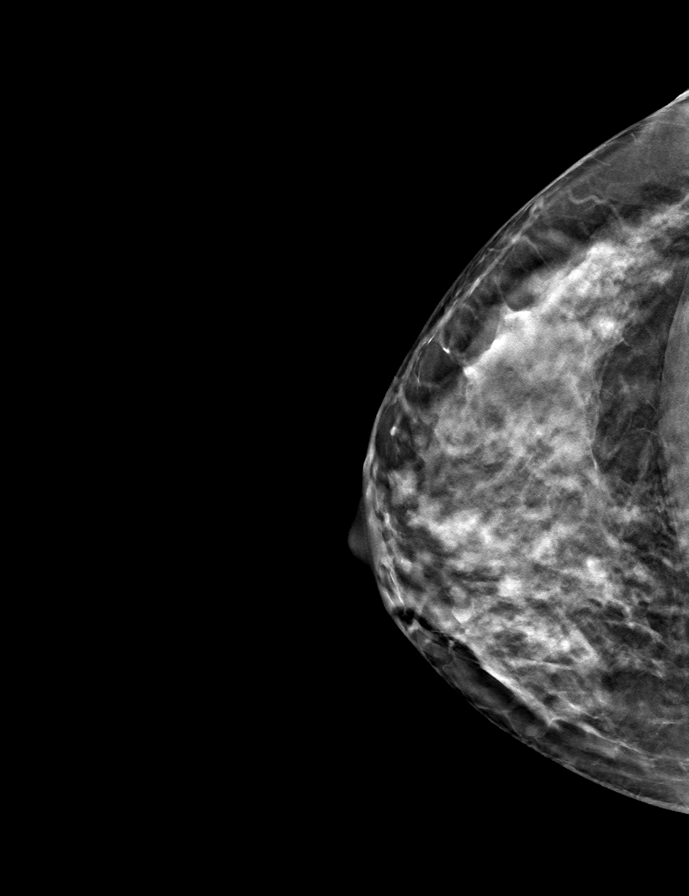

[L MLO tomo · tomo slice 31/61.0]
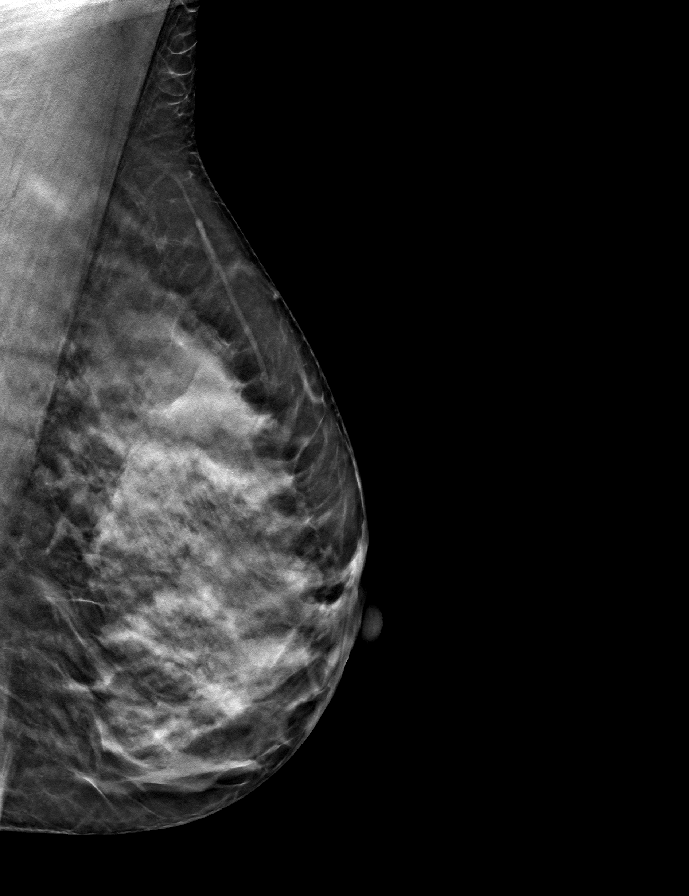

[L CC tomo · tomo slice 30/59.0]
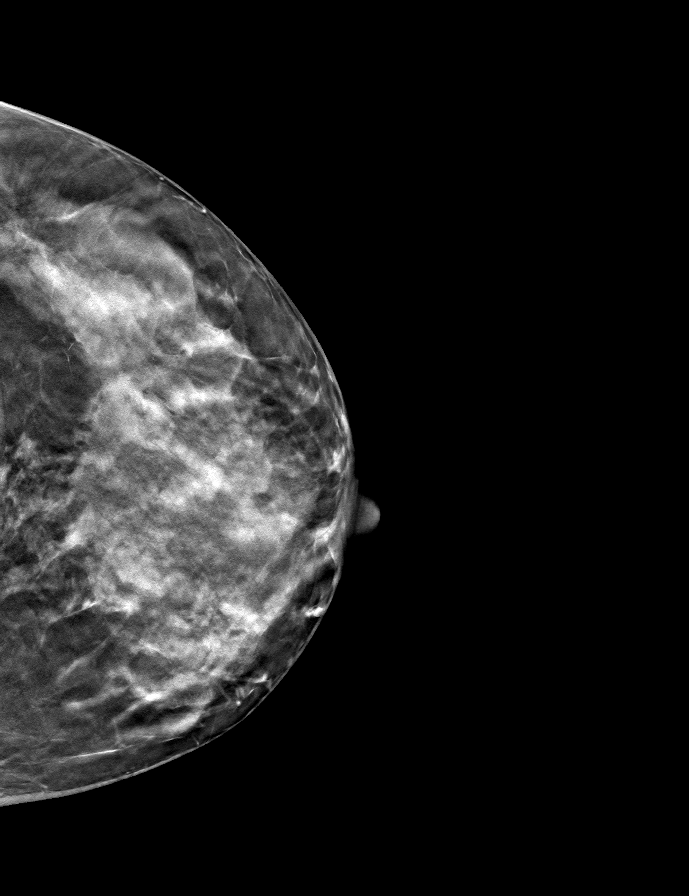

[9 of 24 positions shown; findings below may reference images not displayed]

ACR Breast Density Category c: The breast tissue is heterogeneously
dense, which may obscure small masses.
FINDINGS: There are no findings suspicious for malignancy. Images were
processed with CAD.
IMPRESSION: No mammographic evidence of malignancy. A result letter of this
screening mammogram will be mailed directly to the patient.

RECOMMENDATION:
Screening mammogram in one year. (Code:[id number]-U-LHB)

BI-RADS CATEGORY  1: Negative.

## 2021-03-21 ENCOUNTER — Ambulatory Visit (INDEPENDENT_AMBULATORY_CARE_PROVIDER_SITE_OTHER): Payer: 59 | Admitting: Gastroenterology

## 2021-03-21 ENCOUNTER — Encounter: Payer: Self-pay | Admitting: Gastroenterology

## 2021-03-21 VITALS — BP 120/60 | HR 80 | Ht 58.25 in | Wt 119.2 lb

## 2021-03-21 DIAGNOSIS — R140 Abdominal distension (gaseous): Secondary | ICD-10-CM | POA: Diagnosis not present

## 2021-03-21 MED ORDER — NA SULFATE-K SULFATE-MG SULF 17.5-3.13-1.6 GM/177ML PO SOLN: 1.0000 | ORAL | 0 refills | Status: DC

## 2021-03-21 NOTE — Patient Instructions (Addendum)
If you are age 46 or younger, your body mass index should be between 19-25. Your Body mass index is 24.71 kg/m. If this is out of the aformentioned range listed, please consider follow up with your Primary Care Provider.   ________________________________________________________  The [facility] providers would like to encourage you to use [facility] to communicate with providers for non-urgent requests or questions.  Due to long hold times on the telephone, sending your provider a message by [facility] may be a faster and more efficient way to get a response.  Please allow 48 business hours for a response.  Please remember that this is for non-urgent requests.  _______________________________________________________  Dennis Bast have been scheduled for an endoscopy and colonoscopy. Please follow the written instructions given to you at your visit today. Please pick up your prep supplies at the pharmacy within the next 1-3 days. If you use inhalers (even only as needed), please bring them with you on the day of your procedure.  Due to recent changes in healthcare laws, you may see the results of your imaging and laboratory studies on MyChart before your provider has had a chance to review them.  We understand that in some cases there may be results that are confusing or concerning to you. Not all laboratory results come back in the same time frame and the provider may be waiting for multiple results in order to interpret others.  Please give [place] 48 hours in order for your provider to thoroughly review all the results before contacting the office for clarification of your results.   Thank you for entrusting me with your care and choosing [facility].  Dr Tarri Glenn   Si tiene [address] o menos, su ndice de masa corporal debe estar entre 2 y 17. Su ndice de masa corporal es de 24,71 kg/m. Si esto est fuera del rango mencionado anteriormente, considere hacer un seguimiento con su proveedor de Research scientist (physical sciences).  ________________________________________________________  [employer] de [facility] desean alentarlo a que use MYCHART para comunicarse con los proveedores para solicitudes o preguntas que no sean urgentes. Debido a los Astronomer de espera en el telfono, enviar un mensaje a su proveedor por [employer] puede ser una forma ms rpida y eficiente de obtener una respuesta. Espere 48 horas hbiles para obtener [employer]. Recuerde que esto es para solicitudes no urgentes. _______________________________________________________  Se le ha programado una endoscopia y [place] colonoscopia. Siga las instrucciones escritas que se le dieron en su visita de hoy. Recoja sus suministros de preparacin en la farmacia dentro de los prximos 1 a 3 das. Si [place] inhaladores (aunque solo sea necesario), trigalos el da de su procedimiento.  Debido a cambios recientes en las leyes de atencin mdica, es posible que vea los [place] de sus estudios de imgenes y de laboratorio en MyChart antes de que su proveedor haya tenido la oportunidad de revisarlos. Entendemos que en algunos casos puede haber resultados confusos o preocupantes para usted. No todos los resultados de laboratorio regresan en el mismo perodo de tiempo y el proveedor puede estar esperando mltiples resultados para Primary school teacher. Denos 34 horas para que su proveedor revise minuciosamente todos los resultados antes de comunicarse con la oficina para Audiological scientist.  Gracias por confiarme su atencin y [place].  doctor castores

## 2021-03-21 NOTE — Progress Notes (Signed)
Referring Provider: Isaac Bliss, [facility]* Primary Care Physician:  Isaac Bliss, Rayford Halsted, MD   Reason for Consultation:  Bloating   IMPRESSION:  Bloating occurring 2 hours after eating Uterine fibroid seen on prior pelvic ultrasound Need for colon cancer screening No known family history of colon cancer, colon polyps, or GI disease  Chronic bloating and distension without alarm features.  The differential for bloating includes esophagitis, gastritis, H pylori, small intestinal bacterial overgrowth, carbohydrate maldigestion including lactose and fructose, celiac disease, pancreatic insufficiency, GI dysmotility including gastroparesis, hypothyroidism, functional bloating and body mechanics.  It is also possible that her known fibroids may be contributing.  She doesn't provide classic symptoms to support a diagnosis of IBS, chronic constipation, functional dyspepsia, or pelvic floor dysfunction.   PLAN: - Avoid artificial sweeteners - Consider a trial of a gluten-free diet for at least one week - Continue probiotics - Offered a trial of antispasmodics, but, she would prefer a diagnosis first - EGD with esophageal, gastric and duodenal biopsies - Colonoscopy for colon cancer screening - Trial of rifaxamin 550 mg TID x 14 days if endoscopic evaluation is negative - Low threshold for cross-sectional abd/pelvic images if symptoms persist  HPI: Lisa Villarreal is a 45 y.o. female referred by Dr. Isaac Bliss for further evaluation of abdominal pain and bloating.  She understands that she is due colon cancer screening.  Office visit planned prior to screening colonoscopy given her recent symptoms.  The history is obtained with the assistance of the Spanish interpreter.  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  She reports a 2-monthhistory of postprandial bloating and intermittent lower abdominal discomfort that occurs most days.  This occurs without warning almost 2  hours after eating.  Symptoms last for 1 to 2 hours.  There is no distention.  There is no association with defecation or movement.  No identified exacerbating or relieving features.  She has been avoiding dairy and using only almond milk, added daily probiotics, and is drinking a daily shake based on fruits and vegetables.  Despite these lifestyle changes her symptoms persist.  She does not eat red meat.  She has not identified pork as a symptom trigger.  Weight is stable.  Appetite is good.  She denies associated constipation or diarrhea.  She will have a bowel movement once to twice a day.  There may be incomplete evacuation at times.  There is no blood or mucus in the stools.  There is no pelvic pain or menorrhagia.  She is more interested in her diagnosis than symptomatic relief.  She has been watching YouTube videos and is concerned about the possibility of a bacterial abnormality.  Recent evaluation by Dr. HIsaac Blissfor left upper back pain the patient is concerned about having lung cancer.  She requested imaging for reassurance.  No prior abdominal imaging.  Transvaginal pelvic ultrasound 03/23/2020 showed several intramural fibroids, a small amount of fluid in the endometrial cavity, and was otherwise normal.  No prior endoscopic evaluation.  Labs 01/02/2021 included a normal comprehensive metabolic panel and a normal CBC.  There is no known family history of gastrointestinal disease, colon cancer, or colon polyps.   Past Medical History:  Diagnosis Date   Anemia    Anxiety    Headache, migraine    resolved, none for years   Uterine cyst    Vitamin D deficiency     Past Surgical History:  Procedure Laterality Date   CESAREAN SECTION  Current Outpatient Medications  Medication Sig Dispense Refill   Na Sulfate-K Sulfate-Mg Sulf (SUPREP BOWEL PREP KIT) 17.5-3.13-1.6 GM/177ML SOLN Take 1 kit by mouth as directed. 324 mL 0   norethindrone-ethinyl estradiol-FE  (LOESTRIN FE) 1-20 MG-MCG tablet Take 1 tablet by mouth daily. 84 tablet 4   Vitamin D, Ergocalciferol, (DRISDOL) 1.25 MG (50000 UNIT) CAPS capsule Take 1 capsule (50,000 Units total) by mouth every 7 (seven) days for 12 doses. 12 capsule 0   No current facility-administered medications for this visit.    Allergies as of 03/21/2021 - Review Complete 03/21/2021  Allergen Reaction Noted   Penicillins      Family History  Problem Relation Age of Onset   Heart disease Mother        pacemarker in place   Leukemia Maternal Aunt      Review of Systems: 12 system ROS is negative except as noted above.   Physical Exam: General:   Alert,  well-nourished, pleasant and cooperative in NAD Head:  Normocephalic and atraumatic. Eyes:  Sclera clear, no icterus.   Conjunctiva pink. Ears:  Normal auditory acuity. Nose:  No deformity, discharge,  or lesions. Mouth:  No deformity or lesions.   Neck:  Supple; no masses or thyromegaly. Lungs:  Clear throughout to auscultation.   No wheezes. Heart:  Regular rate and rhythm; no murmurs. Abdomen:  Soft, nontender, nondistended, no tympany, normal bowel sounds, no rebound or guarding. No hepatosplenomegaly.   Rectal:  Deferred  Msk:  Symmetrical. No boney deformities LAD: No inguinal or umbilical LAD Extremities:  No clubbing or edema. Neurologic:  Alert and  oriented x4;  grossly nonfocal Skin:  Intact without significant lesions or rashes. Psych:  Alert and cooperative. Normal mood and affect.    Daylynn Stumpp L. Tarri Glenn, MD, MPH 03/21/2021, 4:34 PM

## 2021-04-16 ENCOUNTER — Encounter: Payer: Self-pay | Admitting: Obstetrics & Gynecology

## 2021-04-16 ENCOUNTER — Ambulatory Visit (INDEPENDENT_AMBULATORY_CARE_PROVIDER_SITE_OTHER): Payer: 59 | Admitting: Obstetrics & Gynecology

## 2021-04-16 ENCOUNTER — Ambulatory Visit (INDEPENDENT_AMBULATORY_CARE_PROVIDER_SITE_OTHER): Payer: 59

## 2021-04-16 ENCOUNTER — Other Ambulatory Visit: Payer: Self-pay

## 2021-04-16 VITALS — BP 102/70

## 2021-04-16 DIAGNOSIS — D219 Benign neoplasm of connective and other soft tissue, unspecified: Secondary | ICD-10-CM

## 2021-04-16 NOTE — Progress Notes (Signed)
° ° °  Lisa Villarreal 1976-01-26 [id number]        45 y.o.  [id number]   RP: F/U on Fibroids for Pelvic US  HPI: Uterine Fibroids on Pelvic US 03/2020.  Well on BCPs.  No BTB.  No pelvic pain.   OB History  Gravida Para Term Preterm AB Living  4 3 3  0 1 3  SAB IAB Ectopic Multiple Live Births  1 0 0 0      # Outcome Date GA Lbr Len/2nd Weight Sex Delivery Anes PTL Lv  4 Term 2005     Vag-Spont     3 Term 2003     Vag-Spont     2 Term 1999     CS-Classical     1 SAB             Past medical history,surgical history, problem list, medications, allergies, family history and social history were all reviewed and documented in the EPIC chart.   Directed ROS with pertinent positives and negatives documented in the history of present illness/assessment and plan.  Exam:  There were no vitals filed for this visit. General appearance:  Normal  Pelvic US today: T/V images.  Anteverted uterus slightly enlarged with multiple intramural fibroids again noted.  The overall uterine size is measured at 10.44 x 8.09 x 5.8 cm.  The largest fibroid is left lateral measuring 4.1 x 6.3 cm.  Previously measured at 5 x 3 cm, therefore no significant change in size.  Symmetrical endometrial lining measured at 9.3 mm with trace amount of fluid seen within the cavity.  No mass or thickening seen at the endometrium.  Both ovaries are atrophic in appearance.  The right ovary presents a single follicle.  The left ovary shows no follicle.  Positive blood perfusion to both ovaries.  No adnexal mass.  Trace amount of free fluid in the posterior cul-de-sac.   Assessment/Plan:  45 y.o. [id number]   1. Fibroids  Uterine Fibroids on Pelvic US 03/2020.  Well on BCPs.  No BTB.  No pelvic pain.  Pelvic US findings thoroughly reviewed.  Relatively stable Uterine Fibroids.  Reassured.  Will repeat a Pelvic US in 1 year with Annual Gyn exam.  Princess Bruins MD, 4:01 PM 04/16/2021

## 2021-04-17 ENCOUNTER — Encounter: Payer: Self-pay | Admitting: Gastroenterology

## 2021-04-17 ENCOUNTER — Ambulatory Visit (AMBULATORY_SURGERY_CENTER): Payer: 59 | Admitting: Gastroenterology

## 2021-04-17 ENCOUNTER — Encounter: Payer: 59 | Admitting: Gastroenterology

## 2021-04-17 VITALS — BP 110/65 | HR 71 | Temp 98.6°F | Resp 12 | Ht <= 58 in | Wt 119.0 lb

## 2021-04-17 DIAGNOSIS — Z1211 Encounter for screening for malignant neoplasm of colon: Secondary | ICD-10-CM | POA: Diagnosis not present

## 2021-04-17 DIAGNOSIS — D122 Benign neoplasm of ascending colon: Secondary | ICD-10-CM | POA: Diagnosis not present

## 2021-04-17 DIAGNOSIS — R140 Abdominal distension (gaseous): Secondary | ICD-10-CM | POA: Diagnosis present

## 2021-04-17 MED ORDER — SODIUM CHLORIDE 0.9 % IV SOLN: 500.0000 mL | Freq: Once | INTRAVENOUS | Status: DC

## 2021-04-17 NOTE — Progress Notes (Signed)
Pt's states no medical or surgical changes since previsit or office visit. 

## 2021-04-17 NOTE — Op Note (Signed)
[facility] Patient Name: Lisa Villarreal [facility] Procedure Date: 04/17/2021 2:52 PM MRN: [id number] Endoscopist: Mallie Mussel L. Loletha Carrow , MD Age: 45 Referring MD:  Date of Birth: 19-Sep-1975 Gender: Female Account #: [id number] Procedure:                Colonoscopy Indications:              Screening for colorectal malignant neoplasm, This                            is the patient's first colonoscopy Medicines:                Monitored Anesthesia Care Procedure:                Pre-Anesthesia Assessment:                           - Prior to the procedure, a History and Physical                            was performed, and patient medications and                            allergies were reviewed. The patient's tolerance of                            previous anesthesia was also reviewed. The risks                            and benefits of the procedure and the sedation                            options and risks were discussed with the patient.                            All questions were answered, and informed consent                            was obtained. Prior Anticoagulants: The patient has                            taken no previous anticoagulant or antiplatelet                            agents. ASA Grade Assessment: I - A normal, healthy                            patient. After reviewing the risks and benefits,                            the patient was deemed in satisfactory condition to                            undergo the procedure.  After obtaining informed consent, the colonoscope                            was passed under direct vision. Throughout the                            procedure, the patient's blood pressure, pulse, and                            oxygen saturations were monitored continuously. The                            Olympus PCF-[id number] (#[id number]) Colonoscope was                            introduced through the anus and  advanced to the the                            terminal ileum, with identification of the                            appendiceal orifice and IC valve. The colonoscopy                            was performed without difficulty. The patient                            tolerated the procedure well. The quality of the                            bowel preparation was excellent. The terminal                            ileum, ileocecal valve, appendiceal orifice, and                            rectum were photographed. Scope In: 3:18:40 PM Scope Out: 3:32:48 PM Scope Withdrawal Time: 0 hours 10 minutes 49 seconds  Total Procedure Duration: 0 hours 14 minutes 8 seconds  Findings:                 The perianal and digital rectal examinations were                            normal.                           The terminal ileum appeared normal.                           A diminutive polyp was found in the ascending                            colon. The polyp was semi-sessile. The polyp was  removed with a cold snare. Resection and retrieval                            were complete.                           The exam was otherwise without abnormality on                            direct and retroflexion views. Complications:            No immediate complications. Estimated Blood Loss:     Estimated blood loss was minimal. Impression:               - The examined portion of the ileum was normal.                           - One diminutive polyp in the ascending colon,                            removed with a cold snare. Resected and retrieved.                           - The examination was otherwise normal on direct                            and retroflexion views. Recommendation:           - Patient has a contact number available for                            emergencies. The signs and symptoms of potential                            delayed complications were discussed  with the                            patient. Return to normal activities tomorrow.                            Written discharge instructions were provided to the                            patient.                           - Resume previous diet.                           - Continue present medications.                           - Await pathology results.                           - Repeat colonoscopy is recommended for  surveillance. The colonoscopy date will be                            determined after pathology results from today's                            exam become available for review. (TBD by Dr.                            Tarri Glenn)                           - See the other procedure note for documentation of                            additional recommendations. Roseann Kees L. Loletha Carrow, MD 04/17/2021 3:46:34 PM This report has been signed electronically.

## 2021-04-17 NOTE — Progress Notes (Signed)
Called to room to assist during endoscopic procedure.  Patient ID and intended procedure confirmed with present staff. Received instructions for my participation in the procedure from the performing physician.  

## 2021-04-17 NOTE — Patient Instructions (Addendum)
YOU HAD AN ENDOSCOPIC PROCEDURE TODAY AT [facility]:   Refer to the procedure report that was given to you for any specific questions about what was found during the examination.  If the procedure report does not answer your questions, please call your gastroenterologist to clarify.  If you requested that your care partner not be given the details of your procedure findings, then the procedure report has been included in a sealed envelope for you to review at your convenience later.  YOU SHOULD EXPECT: Some feelings of bloating in the abdomen. Passage of more gas than usual.  Walking can help get rid of the air that was put into your GI tract during the procedure and reduce the bloating. If you had a lower endoscopy (such as a colonoscopy or flexible sigmoidoscopy) you may notice spotting of blood in your stool or on the toilet paper. If you underwent a bowel prep for your procedure, you may not have a normal bowel movement for a few days.  Please Note:  You might notice some irritation and congestion in your nose or some drainage.  This is from the oxygen used during your procedure.  There is no need for concern and it should clear up in a day or so.  SYMPTOMS TO REPORT IMMEDIATELY:  Following lower endoscopy (colonoscopy or flexible sigmoidoscopy):  Excessive amounts of blood in the stool  Significant tenderness or worsening of abdominal pains  Swelling of the abdomen that is new, acute  Fever of 100F or higher  Following upper endoscopy (EGD)  Vomiting of blood or coffee ground material  New chest pain or pain under the shoulder blades  Painful or persistently difficult swallowing  New shortness of breath  Fever of 100F or higher  Black, tarry-looking stools  For urgent or emergent issues, a gastroenterologist can be reached at any hour by calling [phone]. Do not use MyChart messaging for urgent concerns.    DIET:  We do recommend a small meal at first, but  then you may proceed to your regular diet.  Drink plenty of fluids but you should avoid alcoholic beverages for 24 hours.  ACTIVITY:  You should plan to take it easy for the rest of today and you should NOT DRIVE or use heavy machinery until tomorrow (because of the sedation medicines used during the test).    FOLLOW UP: Our staff will call the number listed on your records 48-72 hours following your procedure to check on you and address any questions or concerns that you may have regarding the information given to you following your procedure. If we do not reach you, we will leave a message.  We will attempt to reach you two times.  During this call, we will ask if you have developed any symptoms of COVID 19. If you develop any symptoms (ie: fever, flu-like symptoms, shortness of breath, cough etc.) before then, please call [phone].  If you test positive for Covid 19 in the 2 weeks post procedure, please call and report this information to [place].    If any biopsies were taken you will be contacted by phone or by letter within the next 1-3 weeks.  Please call us at [phone] if you have not heard about the biopsies in 3 weeks.    SIGNATURES/CONFIDENTIALITY: You and/or your care partner have signed paperwork which will be entered into your electronic medical record.  These signatures attest to the fact that that the information above on your After  Visit Summary has been reviewed and is understood.  Full responsibility of the confidentiality of this discharge information lies with you and/or your care-partner.    Resume medications. Information given on polyps.

## 2021-04-17 NOTE — Op Note (Signed)
[facility] Patient Name: Lisa Villarreal [facility] Procedure Date: 04/17/2021 2:46 PM MRN: [id number] Endoscopist: Mallie Mussel L. Loletha Carrow , MD Age: 45 Referring MD:  Date of Birth: Feb 03, 1976 Gender: Female Account #: [id number] Procedure:                Upper GI endoscopy Indications:              Abdominal bloating Medicines:                Monitored Anesthesia Care Procedure:                Pre-Anesthesia Assessment:                           - Prior to the procedure, a History and Physical                            was performed, and patient medications and                            allergies were reviewed. The patient's tolerance of                            previous anesthesia was also reviewed. The risks                            and benefits of the procedure and the sedation                            options and risks were discussed with the patient.                            All questions were answered, and informed consent                            was obtained. Prior Anticoagulants: The patient has                            taken no previous anticoagulant or antiplatelet                            agents. ASA Grade Assessment: I - A normal, healthy                            patient. After reviewing the risks and benefits,                            the patient was deemed in satisfactory condition to                            undergo the procedure.                           After obtaining informed consent, the endoscope was  passed under direct vision. Throughout the                            procedure, the patient's blood pressure, pulse, and                            oxygen saturations were monitored continuously. The                            GIF [id number] #[id number] was introduced through the                            mouth, and advanced to the second part of duodenum.                            The upper GI endoscopy was accomplished  without                            difficulty. The patient tolerated the procedure                            well. Scope In: Scope Out: Findings:                 The esophagus was normal.                           The entire examined stomach was normal. Several                            biopsies were obtained on the greater curvature of                            the gastric body, on the lesser curvature of the                            gastric body, on the greater curvature of the                            gastric antrum and on the lesser curvature of the                            gastric antrum with cold forceps for histology.                           The cardia and gastric fundus were normal on                            retroflexion.                           The examined duodenum was normal. Biopsies for                            histology were taken with a  cold forceps for                            evaluation of celiac disease. Complications:            No immediate complications. Estimated Blood Loss:     Estimated blood loss was minimal. Impression:               - Normal esophagus.                           - Normal stomach.                           - Normal examined duodenum. Biopsied.                           - Several biopsies were obtained on the greater                            curvature of the gastric body, on the lesser                            curvature of the gastric body, on the greater                            curvature of the gastric antrum and on the lesser                            curvature of the gastric antrum. Recommendation:           - Patient has a contact number available for                            emergencies. The signs and symptoms of potential                            delayed complications were discussed with the                            patient. Return to normal activities tomorrow.                            Written discharge  instructions were provided to the                            patient.                           - Resume previous diet.                           - Continue present medications.                           - Await pathology results.                           -  See the other procedure note for documentation of                            additional recommendations.                           - Dr. Tarri Glenn will review results and make further                            recommendations regarding follow-up. Jamicia Haaland L. Loletha Carrow, MD 04/17/2021 3:49:30 PM This report has been signed electronically.

## 2021-04-17 NOTE — Progress Notes (Signed)
Pt in recovery with monitors in place, VSS. Report given to receiving RN. Bite guard was placed with pt awake to ensure comfort. No dental or soft tissue damage noted. 

## 2021-04-17 NOTE — Progress Notes (Signed)
No changes to clinical history since GI office visit with Dr. Tarri Glenn on 03/21/21. (Patient arrived on incorrect day for procedure, so I am performing them due to availability.) The patient is appropriate for an endoscopic procedure in the ambulatory setting.

## 2021-04-19 ENCOUNTER — Telehealth: Payer: Self-pay | Admitting: *Deleted

## 2021-04-19 ENCOUNTER — Telehealth: Payer: Self-pay

## 2021-04-19 NOTE — Telephone Encounter (Signed)
Left message on answering machine. 

## 2021-04-19 NOTE — Telephone Encounter (Signed)
°  Follow up Call-  Call back number 04/17/2021  Post procedure Call Back phone  # [phone]  Permission to leave phone message Yes  Some recent data might be hidden     Patient questions:  Do you have a fever, pain , or abdominal swelling? No. Pain Score  0 *  Have you tolerated food without any problems? Yes.    Have you been able to return to your normal activities? Yes.    Do you have any questions about your discharge instructions: Diet   No. Medications  No. Follow up visit  No.  Do you have questions or concerns about your Care? No.  Actions: * If pain score is 4 or above: No action needed, pain <4.  Have you developed a fever since your procedure? no  2.   Have you had an respiratory symptoms (SOB or cough) since your procedure? no  3.   Have you tested positive for COVID 19 since your procedure no  4.   Have you had any family members/close contacts diagnosed with the COVID 19 since your procedure?  no   If yes to any of these questions please route to Joylene John, RN and Joella Prince, RN

## 2021-04-19 NOTE — Progress Notes (Signed)
Path results not available today before I leave on vacation. Will have limited access to EHR while on vacation next week.

## 2021-04-21 ENCOUNTER — Encounter: Payer: Self-pay | Admitting: Obstetrics & Gynecology

## 2021-04-22 ENCOUNTER — Other Ambulatory Visit: Payer: Self-pay

## 2021-04-22 DIAGNOSIS — D219 Benign neoplasm of connective and other soft tissue, unspecified: Secondary | ICD-10-CM

## 2021-05-01 ENCOUNTER — Encounter: Payer: Self-pay | Admitting: Gastroenterology

## 2021-08-12 ENCOUNTER — Encounter: Payer: 59 | Admitting: Family Medicine

## 2021-10-22 ENCOUNTER — Other Ambulatory Visit: Payer: Self-pay | Admitting: Internal Medicine

## 2021-10-22 DIAGNOSIS — Z1231 Encounter for screening mammogram for malignant neoplasm of breast: Secondary | ICD-10-CM

## 2021-10-24 ENCOUNTER — Ambulatory Visit (INDEPENDENT_AMBULATORY_CARE_PROVIDER_SITE_OTHER): Payer: Self-pay | Admitting: Family Medicine

## 2021-10-24 VITALS — BP 106/70 | HR 76 | Temp 98.2°F | Wt 120.4 lb

## 2021-10-24 DIAGNOSIS — L819 Disorder of pigmentation, unspecified: Secondary | ICD-10-CM

## 2021-10-24 NOTE — Progress Notes (Signed)
Subjective:    Patient ID: Lisa Villarreal, female    DOB: 1976/01/19, 46 y.o.   MRN: [id number]  Chief Complaint  Patient presents with   Skin Discoloration    On right side of breast. Pt reports she thinks its coming from her bra. At first "scar" is red now to brown. Going on 3 months.     HPI Patient was seen today for ongoing concern.  Pt had a red spot on medial R breast that has become brown in color.  Pt notes when area appeared the underwire in her bra was digging into her skin at that same spot.  Area is not tender, without drainage, itching or other irritation.  Past Medical History:  Diagnosis Date   Anemia    Anxiety    Headache, migraine    resolved, none for years   Uterine cyst    Vitamin D deficiency     Allergies  Allergen Reactions   Penicillins     REACTION: Swelling    ROS General: Denies fever, chills, night sweats, changes in weight, changes in appetite HEENT: Denies headaches, ear pain, changes in vision, rhinorrhea, sore throat CV: Denies CP, palpitations, SOB, orthopnea Pulm: Denies SOB, cough, wheezing GI: Denies abdominal pain, nausea, vomiting, diarrhea, constipation GU: Denies dysuria, hematuria, frequency, vaginal discharge Msk: Denies muscle cramps, joint pains Neuro: Denies weakness, numbness, tingling Skin: Denies rashes, bruising  +bump on R breast Psych: Denies depression, anxiety, hallucinations      Objective:    Blood pressure 106/70, pulse 76, temperature 98.2 F (36.8 C), temperature source Oral, weight 120 lb 6.4 oz (54.6 kg), SpO2 97 %.  Gen. Pleasant, well-nourished, in no distress, normal affect   HEENT: [place]/AT, face symmetric, no scleral icterus, nares patent without drainage, Lungs: no accessory muscle use Cardiovascular: RRR, o peripheral edema Musculoskeletal: No deformities, no cyanosis or clubbing, normal tone Neuro:  A&Ox3, CN II-XII intact, normal gait Skin:  Warm, dry, intact.  R inferior medial breast with  6 mm hyperpigmented flat area, symmetric borders, no drainage, no color variation, no erythema.   Wt Readings from Last 3 Encounters:  10/24/21 120 lb 6.4 oz (54.6 kg)  04/17/21 119 lb (54 kg)  03/21/21 119 lb 4 oz (54.1 kg)    Lab Results  Component Value Date   WBC 7.1 01/02/2021   HGB 12.7 01/02/2021   HCT 37.9 01/02/2021   PLT 314.0 01/02/2021   GLUCOSE 92 01/02/2021   CHOL 136 01/02/2021   TRIG 116.0 01/02/2021   HDL 52.60 01/02/2021   LDLCALC 60 01/02/2021   ALT 12 01/02/2021   AST 13 01/02/2021   NA 136 01/02/2021   K 4.1 01/02/2021   CL 104 01/02/2021   CREATININE 0.82 01/02/2021   BUN 12 01/02/2021   CO2 25 01/02/2021   TSH 4.33 01/02/2021   HGBA1C 5.8 01/02/2021    Assessment/Plan:  Hyperpigmented skin lesion -likely due skin irritation/deep bruising due to underwire in bra digging into skin. -pt encouraged to continue wearing bra without underwire for a while. -consider having a professional bra fitting -discussed monitoring area, for any changes in size, color, etc. discussed removal.  Pt advised to take a picture of area to be able to better assess for any changes. -given handout  F/u prn with pcp  Grier Mitts, MD

## 2021-11-10 ENCOUNTER — Encounter: Payer: Self-pay | Admitting: Family Medicine

## 2021-12-02 ENCOUNTER — Ambulatory Visit
Admission: RE | Admit: 2021-12-02 | Discharge: 2021-12-02 | Disposition: A | Discharge status: Home / Self Care | Payer: Commercial Managed Care - HMO | Source: Ambulatory Visit | Attending: Internal Medicine | Admitting: Internal Medicine

## 2021-12-02 DIAGNOSIS — Z1231 Encounter for screening mammogram for malignant neoplasm of breast: Secondary | ICD-10-CM

## 2021-12-03 ENCOUNTER — Ambulatory Visit (INDEPENDENT_AMBULATORY_CARE_PROVIDER_SITE_OTHER): Payer: Commercial Managed Care - HMO | Admitting: Internal Medicine

## 2021-12-03 VITALS — BP 124/58 | HR 75 | Temp 98.2°F | Ht 58.27 in | Wt 121.9 lb

## 2021-12-03 DIAGNOSIS — E559 Vitamin D deficiency, unspecified: Secondary | ICD-10-CM | POA: Diagnosis not present

## 2021-12-03 DIAGNOSIS — Z0000 Encounter for general adult medical examination without abnormal findings: Secondary | ICD-10-CM | POA: Diagnosis not present

## 2021-12-03 DIAGNOSIS — Z1283 Encounter for screening for malignant neoplasm of skin: Secondary | ICD-10-CM

## 2021-12-03 DIAGNOSIS — Z0100 Encounter for examination of eyes and vision without abnormal findings: Secondary | ICD-10-CM | POA: Diagnosis not present

## 2021-12-03 NOTE — Progress Notes (Signed)
Established Patient Office Visit     CC/Reason for Visit: Annual preventive exam  HPI: Lisa Villarreal is a 46 y.o. female who is coming in today for the above mentioned reasons. Past Medical History is significant for: Vitamin D deficiency and iron deficiency anemia.  She is doing well today.  She is requesting dermatology referral.  She is overdue for an eye exam.  She has routine dental care.  She is due for a bivalent COVID-vaccine.  She had her mammogram done yesterday.  She had a colonoscopy in 2022, she has a GYN appointment for later this month.  She has no acute concerns or complaints.   Past Medical/Surgical History: Past Medical History:  Diagnosis Date   Anemia    Anxiety    Headache, migraine    resolved, none for years   Uterine cyst    Vitamin D deficiency     Past Surgical History:  Procedure Laterality Date   CESAREAN SECTION      Social History:  reports that she has never smoked. She has never used smokeless tobacco. She reports that she does not drink alcohol and does not use drugs.  Allergies: Allergies  Allergen Reactions   Penicillins     REACTION: Swelling    Family History:  Family History  Problem Relation Age of Onset   Heart disease Mother        pacemarker in place   Leukemia Maternal Aunt      Current Outpatient Medications:    norethindrone-ethinyl estradiol-FE (LOESTRIN FE) 1-20 MG-MCG tablet, Take 1 tablet by mouth daily., Disp: 84 tablet, Rfl: 4  Review of Systems:  Constitutional: Denies fever, chills, diaphoresis, appetite change and fatigue.  HEENT: Denies photophobia, eye pain, redness, hearing loss, ear pain, congestion, sore throat, rhinorrhea, sneezing, mouth sores, trouble swallowing, neck pain, neck stiffness and tinnitus.   Respiratory: Denies SOB, DOE, cough, chest tightness,  and wheezing.   Cardiovascular: Denies chest pain, palpitations and leg swelling.  Gastrointestinal: Denies nausea, vomiting,  abdominal pain, diarrhea, constipation, blood in stool and abdominal distention.  Genitourinary: Denies dysuria, urgency, frequency, hematuria, flank pain and difficulty urinating.  Endocrine: Denies: hot or cold intolerance, sweats, changes in hair or nails, polyuria, polydipsia. Musculoskeletal: Denies myalgias, back pain, joint swelling, arthralgias and gait problem.  Skin: Denies pallor, rash and wound.  Neurological: Denies dizziness, seizures, syncope, weakness, light-headedness, numbness and headaches.  Hematological: Denies adenopathy. Easy bruising, personal or family bleeding history  Psychiatric/Behavioral: Denies suicidal ideation, mood changes, confusion, nervousness, sleep disturbance and agitation    Physical Exam: Vitals:   12/03/21 1501  BP: (!) 124/58  Pulse: 75  Temp: 98.2 F (36.8 C)  TempSrc: Oral  SpO2: 97%  Weight: 121 lb 14.4 oz (55.3 kg)  Height: 4' 10.27" (1.48 m)    Body mass index is 25.24 kg/m.   Constitutional: NAD, calm, comfortable Eyes: PERRL, lids and conjunctivae normal ENMT: Mucous membranes are moist. Posterior pharynx clear of any exudate or lesions. Normal dentition. Tympanic membrane is pearly white, no erythema or bulging. Neck: normal, supple, no masses, no thyromegaly Respiratory: clear to auscultation bilaterally, no wheezing, no crackles. Normal respiratory effort. No accessory muscle use.  Cardiovascular: Regular rate and rhythm, no murmurs / rubs / gallops. No extremity edema. 2+ pedal pulses. No carotid bruits.  Abdomen: no tenderness, no masses palpated. No hepatosplenomegaly. Bowel sounds positive.  Musculoskeletal: no clubbing / cyanosis. No joint deformity upper and lower extremities. Good ROM, no contractures.  Normal muscle tone.  Skin: no rashes, lesions, ulcers. No induration Neurologic: CN 2-12 grossly intact. Sensation intact, DTR normal. Strength 5/5 in all 4.  Psychiatric: Normal judgment and insight. Alert and oriented  x 3. Normal mood.     Impression and Plan:  Encounter for preventive health examination  - Plan: CBC with Differential/Platelet, Comprehensive metabolic panel, Hemoglobin A1c, Lipid panel, TSH, VITAMIN D 25 Hydroxy (Vit-D Deficiency, Fractures), Vitamin B12 -Recommend routine eye and dental care. -Immunizations: Advised bivalent COVID-vaccine -Healthy lifestyle discussed in detail. -Labs to be updated today. -Colon cancer screening: 11/2020 -Breast cancer screening: 11/2021 -Cervical cancer screening: Has appointment in October with GYN -Lung cancer screening: Not applicable -Prostate cancer screening: Not applicable -DEXA: Not applicable  Encounter for vision screening  - Plan: Ambulatory referral to Ophthalmology  Skin cancer screening  - Plan: Ambulatory referral to Dermatology  Vitamin D deficiency -Check levels    Seraphine Gudiel Isaac Bliss, MD [facility]

## 2021-12-04 ENCOUNTER — Telehealth: Payer: Self-pay | Admitting: Internal Medicine

## 2021-12-04 ENCOUNTER — Other Ambulatory Visit: Payer: Self-pay | Admitting: Internal Medicine

## 2021-12-04 ENCOUNTER — Other Ambulatory Visit (INDEPENDENT_AMBULATORY_CARE_PROVIDER_SITE_OTHER): Payer: Commercial Managed Care - HMO

## 2021-12-04 DIAGNOSIS — Z0000 Encounter for general adult medical examination without abnormal findings: Secondary | ICD-10-CM

## 2021-12-04 DIAGNOSIS — E559 Vitamin D deficiency, unspecified: Secondary | ICD-10-CM

## 2021-12-04 DIAGNOSIS — R928 Other abnormal and inconclusive findings on diagnostic imaging of breast: Secondary | ICD-10-CM

## 2021-12-04 LAB — COMPREHENSIVE METABOLIC PANEL
ALT: 12 U/L (ref 0–35)
AST: 12 U/L (ref 0–37)
Albumin: 4 g/dL (ref 3.5–5.2)
Alkaline Phosphatase: 39 U/L (ref 39–117)
BUN: 12 mg/dL (ref 6–23)
CO2: 24 mEq/L (ref 19–32)
Calcium: 8.8 mg/dL (ref 8.4–10.5)
Chloride: 103 mEq/L (ref 96–112)
Creatinine, Ser: 0.76 mg/dL (ref 0.40–1.20)
GFR: 93.6 mL/min (ref >60.00)
Glucose, Bld: 91 mg/dL (ref 70–99)
Potassium: 3.8 mEq/L (ref 3.5–5.1)
Sodium: 134 mEq/L — ABNORMAL LOW (ref 135–145)
Total Bilirubin: 0.5 mg/dL (ref 0.2–1.2)
Total Protein: 6.8 g/dL (ref 6.0–8.3)

## 2021-12-04 LAB — CBC WITH DIFFERENTIAL/PLATELET
Basophils Absolute: 0 10*3/uL (ref 0.0–0.1)
Basophils Relative: 0.4 % (ref 0.0–3.0)
Eosinophils Absolute: 0.1 10*3/uL (ref 0.0–0.7)
Eosinophils Relative: 1.9 % (ref 0.0–5.0)
HCT: 36.3 % (ref 36.0–46.0)
Hemoglobin: 12.2 g/dL (ref 12.0–15.0)
Lymphocytes Relative: 31.2 % (ref 12.0–46.0)
Lymphs Abs: 2.4 10*3/uL (ref 0.7–4.0)
MCHC: 33.5 g/dL (ref 30.0–36.0)
MCV: 86.8 fl (ref 78.0–100.0)
Monocytes Absolute: 0.5 10*3/uL (ref 0.1–1.0)
Monocytes Relative: 6.5 % (ref 3.0–12.0)
Neutro Abs: 4.6 10*3/uL (ref 1.4–7.7)
Neutrophils Relative %: 60 % (ref 43.0–77.0)
Platelets: 309 10*3/uL (ref 150.0–400.0)
RBC: 4.18 Mil/uL (ref 3.87–5.11)
RDW: 13.5 % (ref 11.5–15.5)
WBC: 7.6 10*3/uL (ref 4.0–10.5)

## 2021-12-04 LAB — LIPID PANEL
Cholesterol: 142 mg/dL (ref 0–200)
HDL: 54.4 mg/dL (ref >39.00)
LDL Cholesterol: 70 mg/dL (ref 0–99)
NonHDL: 87.54
Total CHOL/HDL Ratio: 3
Triglycerides: 89 mg/dL (ref 0.0–149.0)
VLDL: 17.8 mg/dL (ref 0.0–40.0)

## 2021-12-04 LAB — VITAMIN B12: Vitamin B-12: 538 pg/mL (ref 211–911)

## 2021-12-04 LAB — TSH: TSH: 3.57 u[IU]/mL (ref 0.35–5.50)

## 2021-12-04 LAB — HEMOGLOBIN A1C: Hgb A1c MFr Bld: 5.6 % (ref 4.6–6.5)

## 2021-12-04 LAB — VITAMIN D 25 HYDROXY (VIT D DEFICIENCY, FRACTURES): VITD: 21.42 ng/mL — ABNORMAL LOW (ref 30.00–100.00)

## 2021-12-04 MED ORDER — VITAMIN D (ERGOCALCIFEROL) 1.25 MG (50000 UNIT) PO CAPS: 50000.0000 [IU] | ORAL_CAPSULE | ORAL | 0 refills | Status: AC

## 2021-12-04 NOTE — Telephone Encounter (Signed)
Pt states her mammogram shows further attention needed.  She would like someone to call her once the dr has read her results.

## 2021-12-05 NOTE — Telephone Encounter (Signed)
Message below relayed to the patient via [place] with [employer] [phone]).

## 2021-12-26 ENCOUNTER — Ambulatory Visit: Payer: Commercial Managed Care - HMO

## 2021-12-26 ENCOUNTER — Ambulatory Visit
Admission: RE | Admit: 2021-12-26 | Discharge: 2021-12-26 | Disposition: A | Discharge status: Home / Self Care | Payer: Commercial Managed Care - HMO | Source: Ambulatory Visit | Attending: Internal Medicine | Admitting: Internal Medicine

## 2021-12-26 ENCOUNTER — Other Ambulatory Visit: Payer: Self-pay | Admitting: Internal Medicine

## 2021-12-26 DIAGNOSIS — R928 Other abnormal and inconclusive findings on diagnostic imaging of breast: Secondary | ICD-10-CM

## 2021-12-26 DIAGNOSIS — R921 Mammographic calcification found on diagnostic imaging of breast: Secondary | ICD-10-CM

## 2022-03-07 ENCOUNTER — Encounter: Payer: Self-pay | Admitting: Obstetrics & Gynecology

## 2022-03-07 ENCOUNTER — Ambulatory Visit (INDEPENDENT_AMBULATORY_CARE_PROVIDER_SITE_OTHER): Payer: Commercial Managed Care - HMO | Admitting: Obstetrics & Gynecology

## 2022-03-07 VITALS — BP 110/64 | HR 76 | Ht 58.25 in | Wt 117.0 lb

## 2022-03-07 DIAGNOSIS — Z3041 Encounter for surveillance of contraceptive pills: Secondary | ICD-10-CM

## 2022-03-07 DIAGNOSIS — Z23 Encounter for immunization: Secondary | ICD-10-CM | POA: Diagnosis not present

## 2022-03-07 DIAGNOSIS — D219 Benign neoplasm of connective and other soft tissue, unspecified: Secondary | ICD-10-CM | POA: Diagnosis not present

## 2022-03-07 DIAGNOSIS — Z01419 Encounter for gynecological examination (general) (routine) without abnormal findings: Secondary | ICD-10-CM

## 2022-03-07 MED ORDER — NORETHIN ACE-ETH ESTRAD-FE 1-20 MG-MCG PO TABS: 1.0000 | ORAL_TABLET | Freq: Every day | ORAL | 4 refills | Status: DC

## 2022-03-07 NOTE — Progress Notes (Signed)
Lisa Villarreal October 31, 1975 [id number]   History:    46 y.o. [id number] Married.  Daughters 25, 81, 45 yo.   RP:  Established patient presenting for annual gyn exam    HPI: Well on the generic of LoEstrin Fe 1/20.  Light flow, no BTB.  Using to control menorrhagia and contraception. No pelvic pain. No pain with IC, using condoms.  Known Fibroids, stable on Pelvic US 04/2021.  Pap 03/2021 Neg. No h/o abnormal Pap.  Will repeat Pap at 2-3 yrs.  Urine/BMs normal.  Breasts normal. Screening mammo 11/2021, Bilateral Dx/US 12/2021 Probably Benign, repeat at 6 months.  BMI 24.24. Good fitness, cardio 5 times a week.  Healthy nutrition. Health labs with Fam MD.  Will see Dermato, referred by [facility] MD.  Recommend Colono.    Past medical history,surgical history, family history and social history were all reviewed and documented in the EPIC chart.  Gynecologic History Patient's last menstrual period was 03/04/2022 (exact date).  Obstetric History OB History  Gravida Para Term Preterm AB Living  '4 3 3 '$ 0 1 3  SAB IAB Ectopic Multiple Live Births  1 0 0 0      # Outcome Date GA Lbr Len/2nd Weight Sex Delivery Anes PTL Lv  4 Term 2005     Vag-Spont     3 Term 2003     Vag-Spont     2 Term 1999     CS-Classical     1 SAB              ROS: A ROS was performed and pertinent positives and negatives are included in the history.  GENERAL: No fevers or chills. HEENT: No change in vision, no earache, sore throat or sinus congestion. NECK: No pain or stiffness. CARDIOVASCULAR: No chest pain or pressure. No palpitations. PULMONARY: No shortness of breath, cough or wheeze. GASTROINTESTINAL: No abdominal pain, nausea, vomiting or diarrhea, melena or bright red blood per rectum. GENITOURINARY: No urinary frequency, urgency, hesitancy or dysuria. MUSCULOSKELETAL: No joint or muscle pain, no back pain, no recent trauma. DERMATOLOGIC: No rash, no itching, no lesions. ENDOCRINE: No polyuria, polydipsia, no heat  or cold intolerance. No recent change in weight. HEMATOLOGICAL: No anemia or easy bruising or bleeding. NEUROLOGIC: No headache, seizures, numbness, tingling or weakness. PSYCHIATRIC: No depression, no loss of interest in normal activity or change in sleep pattern.     Exam:   BP 110/64   Pulse 76   Ht 4' 10.25" (1.48 m)   Wt 117 lb (53.1 kg)   LMP 03/04/2022 (Exact Date) Comment: ocps  SpO2 98%   BMI 24.24 kg/m   Body mass index is 24.24 kg/m.  General appearance : Well developed well nourished female. No acute distress HEENT: Eyes: no retinal hemorrhage or exudates,  Neck supple, trachea midline, no carotid bruits, no thyroidmegaly Lungs: Clear to auscultation, no rhonchi or wheezes, or rib retractions  Heart: Regular rate and rhythm, no murmurs or gallops Breast:Examined in sitting and supine position were symmetrical in appearance, no palpable masses or tenderness,  no skin retraction, no nipple inversion, no nipple discharge, no skin discoloration, no axillary or supraclavicular lymphadenopathy Abdomen: no palpable masses or tenderness, no rebound or guarding Extremities: no edema or skin discoloration or tenderness  Pelvic: Vulva: Normal             Vagina: No gross lesions or discharge  Cervix: No gross lesions or discharge  Uterus  AV, nodular, about 10 cm,  non-tender and mobile  Adnexa  Without masses or tenderness  Anus: Normal   Assessment/Plan:  46 y.o. female for annual exam   1. Encounter for routine gynecological examination with Papanicolaou smear of cervix Well on the generic of LoEstrin Fe 1/20.  Light flow, no BTB.  Using to control menorrhagia and contraception. No pelvic pain. No pain with IC, using condoms.  Known Fibroids, stable on Pelvic US 04/2021.  Pap 03/2021 Neg. No h/o abnormal Pap.  Will repeat Pap at 2-3 yrs.  Urine/BMs normal.  Breasts normal. Screening mammo 11/2021, Bilateral Dx/US 12/2021 Probably Benign, repeat at 6 months.  BMI 24.24. Good  fitness, cardio 5 times a week.  Healthy nutrition. Health labs with Fam MD.  Will see Dermato, referred by [facility] MD.  Recommend Colono.  2. Encounter for surveillance of contraceptive pills Well on the generic of LoEstrin Fe 1/20.  Light flow, no BTB.  Using to control menorrhagia and contraception. No pelvic pain. No pain with IC.  No CI to BCPs.  Prescription sent to pharmacy.  3. Fibroids Stable per Pelvic US 04/2021 and on exam today.    4. Need for immunization against influenza - Flu Vaccine QUAD 59moIM (Fluarix, Fluzone & Alfiuria Quad PF)  Other orders - Multiple Vitamin (MULTIVITAMIN PO); Take by mouth. - norethindrone-ethinyl estradiol-FE (LOESTRIN FE) 1-20 MG-MCG tablet; Take 1 tablet by mouth daily.   MPrincess BruinsMD, 4:13 PM 03/07/2022

## 2022-04-15 ENCOUNTER — Other Ambulatory Visit: Payer: 59 | Admitting: Obstetrics & Gynecology

## 2022-04-15 ENCOUNTER — Other Ambulatory Visit: Payer: 59

## 2022-04-17 ENCOUNTER — Other Ambulatory Visit: Payer: 59

## 2022-04-17 ENCOUNTER — Other Ambulatory Visit: Payer: 59 | Admitting: Obstetrics & Gynecology

## 2022-06-30 ENCOUNTER — Ambulatory Visit
Admission: RE | Admit: 2022-06-30 | Discharge: 2022-06-30 | Disposition: A | Discharge status: Home / Self Care | Payer: Self-pay | Source: Ambulatory Visit | Attending: Internal Medicine | Admitting: Internal Medicine

## 2022-06-30 ENCOUNTER — Other Ambulatory Visit: Payer: Self-pay | Admitting: Internal Medicine

## 2022-06-30 DIAGNOSIS — R921 Mammographic calcification found on diagnostic imaging of breast: Secondary | ICD-10-CM

## 2022-08-11 ENCOUNTER — Ambulatory Visit (INDEPENDENT_AMBULATORY_CARE_PROVIDER_SITE_OTHER): Payer: 59 | Admitting: Obstetrics & Gynecology

## 2022-08-11 ENCOUNTER — Encounter: Payer: Self-pay | Admitting: Obstetrics & Gynecology

## 2022-08-11 VITALS — BP 118/72 | HR 82 | Resp 20

## 2022-08-11 DIAGNOSIS — Z7251 High risk heterosexual behavior: Secondary | ICD-10-CM | POA: Diagnosis not present

## 2022-08-11 DIAGNOSIS — D219 Benign neoplasm of connective and other soft tissue, unspecified: Secondary | ICD-10-CM

## 2022-08-11 DIAGNOSIS — Z3041 Encounter for surveillance of contraceptive pills: Secondary | ICD-10-CM

## 2022-08-11 DIAGNOSIS — N921 Excessive and frequent menstruation with irregular cycle: Secondary | ICD-10-CM | POA: Diagnosis not present

## 2022-08-11 NOTE — Progress Notes (Signed)
Lisa Villarreal 1975-09-03 [id number]        47 y.o.  [id number]   RP: BTB on BCPs  HPI: Was well on LoEstrin Fe 1/20 with menses every month, until she missed a pill 12 days ago.  BTB every day since then.  Occasionally misses a pill, would like to change contraceptive.  Not sexually active x > 1 month.  No pelvic pain. No need for STI screen.   OB History  Gravida Para Term Preterm AB Living  4 3 3  0 1 3  SAB IAB Ectopic Multiple Live Births  1 0 0 0      # Outcome Date GA Lbr Len/2nd Weight Sex Delivery Anes PTL Lv  4 Term 2005     Vag-Spont     3 Term 2003     Vag-Spont     2 Term 1999     CS-Classical     1 SAB             Past medical history,surgical history, problem list, medications, allergies, family history and social history were all reviewed and documented in the EPIC chart.   Directed ROS with pertinent positives and negatives documented in the history of present illness/assessment and plan.  Exam:  Vitals:   08/11/22 1635 08/11/22 1638  BP: (!) 140/72 118/72  Pulse: 82   Resp: 20   SpO2: 98%    General appearance:  Normal  Abdomen: Normal  Gynecologic exam: Vulva normal.  Bimanual exam:  Uterus irregular about 10 cm, with hard fibroid anteriorly, mobile, NT.  No adnexal mass felt.  Very mild dark blood.  Pelvic US 04/16/21: T/V images.  Anteverted uterus slightly enlarged with multiple intramural fibroids again noted.  The overall uterine size is measured at 10.44 x 8.09 x 5.8 cm.  The largest fibroid is left lateral measuring 4.1 x 6.3 cm.  Previously measured at 5 x 3 cm, therefore no significant change in size.  Symmetrical endometrial lining measured at 9.3 mm with trace amount of fluid seen within the cavity.  No mass or thickening seen at the endometrium.  Both ovaries are atrophic in appearance.  The right ovary presents a single follicle.  The left ovary shows no follicle. Positive blood perfusion to both ovaries.  No adnexal mass.  Trace  amount of free fluid in the posterior cul-de-sac.    Assessment/Plan:  47 y.o. [id number]   1. Breakthrough bleeding on birth control pills Was well on LoEstrin Fe 1/20 with menses every month, until she missed a pill 12 days ago.  BTB every day since then.  Occasionally misses a pill, would like to change contraceptive.  Not sexually active x > 1 month.  No pelvic pain. No need for STI screen. Gyn exam similar to previous exams.  Very mild bleeding.  Will check CBC/TSH today.  Increase Iron in nutrition and recommend a supplement.  F/u with a Pelvic US, possible EBx.  Will decide on management based on findings.  May switch to Progesterone IUD.  Patient enquired about surgical management, in particular about Hysterectomy. - CBC - TSH - US Transvaginal Non-OB; Future  2. Fibroids Last Pelvic US 04/2021 largest fibroid 6.3 cm. - US Transvaginal Non-OB; Future  3. Encounter for surveillance of contraceptive pills Missed a BCP 12 days ago.  Would like to change contraception.  Interested in Progesterone IUD.  Will f/u for a Pelvic US and decide on management.  4. Unprotected sexual intercourse Occasionally misses a  BCP, last missed BCP 12 days ago.  Not sexually active x > a month.  UPT Neg - Pregnancy, urine   Genia Del MD, 4:51 PM 08/11/2022

## 2022-08-12 LAB — TIQ-NTM

## 2022-08-12 LAB — CBC
HCT: 35.7 % (ref 35.0–45.0)
Hemoglobin: 11.8 g/dL (ref 11.7–15.5)
MCH: 28.7 pg (ref 27.0–33.0)
MCHC: 33.1 g/dL (ref 32.0–36.0)
MCV: 86.9 fL (ref 80.0–100.0)
MPV: 10.5 fL (ref 7.5–12.5)
Platelets: 350 10*3/uL (ref 140–400)
RBC: 4.11 10*6/uL (ref 3.80–5.10)
RDW: 12.9 % (ref 11.0–15.0)
WBC: 7.8 10*3/uL (ref 3.8–10.8)

## 2022-08-12 LAB — TSH: TSH: 3.29 mIU/L

## 2022-08-14 LAB — PREGNANCY, URINE: Preg Test, Ur: NEGATIVE

## 2022-09-25 ENCOUNTER — Ambulatory Visit (INDEPENDENT_AMBULATORY_CARE_PROVIDER_SITE_OTHER): Payer: 59 | Admitting: Obstetrics & Gynecology

## 2022-09-25 ENCOUNTER — Encounter: Payer: Self-pay | Admitting: Obstetrics & Gynecology

## 2022-09-25 ENCOUNTER — Ambulatory Visit (INDEPENDENT_AMBULATORY_CARE_PROVIDER_SITE_OTHER): Payer: 59

## 2022-09-25 VITALS — BP 110/64 | HR 80

## 2022-09-25 DIAGNOSIS — Z308 Encounter for other contraceptive management: Secondary | ICD-10-CM | POA: Diagnosis not present

## 2022-09-25 DIAGNOSIS — D219 Benign neoplasm of connective and other soft tissue, unspecified: Secondary | ICD-10-CM

## 2022-09-25 DIAGNOSIS — N921 Excessive and frequent menstruation with irregular cycle: Secondary | ICD-10-CM

## 2022-09-25 NOTE — Progress Notes (Signed)
    Lisa Villarreal 24-Mar-1976 [id number]        47 y.o.  [id number]   RP: BTB on BCPs for Pelvic US   HPI: Patient seen on 08/11/2022:  Was well on LoEstrin Fe 1/20 with menses every month, until she missed a pill.  BTB every day for 2 weeks.  Occasionally misses a pill, would like to change contraceptive to Progesterone IUD.  Not sexually active currently.  No pelvic pain. No need for STI screen.   OB History  Gravida Para Term Preterm AB Living  4 3 3  0 1 3  SAB IAB Ectopic Multiple Live Births  1 0 0 0      # Outcome Date GA Lbr Len/2nd Weight Sex Delivery Anes PTL Lv  4 Term 2005     Vag-Spont     3 Term 2003     Vag-Spont     2 Term 1999     CS-Classical     1 SAB             Past medical history,surgical history, problem list, medications, allergies, family history and social history were all reviewed and documented in the EPIC chart.   Directed ROS with pertinent positives and negatives documented in the history of present illness/assessment and plan.  Exam:  Vitals:   09/25/22 1540  BP: 110/64  Pulse: 80  SpO2: 99%   General appearance:  Normal  Pelvic US today: Comparison is made with previous scan December 2022.  T/V images.  Anteverted mildly enlarged uterus with fibroids again noted.  The overall uterine size is measured at 9.47 x 9.26 x 5.67 cm.  No change in the largest fibroid which is measured at 6 x 4 cm and is located to the left side of the uterus.  2 new smaller fibroids are identified, both below 2 cm.  The endometrial lining is symmetrical and thinner than on the previous scan, measured at 6.4 mm with no mass or thickening seen.  Right ovary is small with no visible follicles.  Left ovary with a single simple follicle measured at 2.3 cm.  No adnexal mass.  No free fluid in the pelvis.   Assessment/Plan:  47 y.o. [id number]   1. Breakthrough bleeding on birth control pills Patient seen on 08/11/2022:  Was well on LoEstrin Fe 1/20 with menses every month,  until she missed a pill.  BTB every day for 2 weeks.  Occasionally misses a pill, would like to change contraceptive to Progesterone IUD.  Not sexually active currently.  No pelvic pain. No need for STI screen. Pelvic US findings reviewed with patient.  Normal endometrial line.  Decision to proceed with insertion of Mirena IUD at next visit.  Will continue on the BCPs until then.  2. Encounter for other contraceptive management Counseling on Mirena IUD done.  No CI.  F/U Mirena IUD insertion. - IUD Insertion; Future   Genia Del MD, 3:49 PM 09/25/2022

## 2022-10-07 ENCOUNTER — Ambulatory Visit (INDEPENDENT_AMBULATORY_CARE_PROVIDER_SITE_OTHER): Payer: 59 | Admitting: Obstetrics & Gynecology

## 2022-10-07 ENCOUNTER — Encounter: Payer: Self-pay | Admitting: Obstetrics & Gynecology

## 2022-10-07 VITALS — BP 112/62 | HR 74

## 2022-10-07 DIAGNOSIS — N921 Excessive and frequent menstruation with irregular cycle: Secondary | ICD-10-CM

## 2022-10-07 DIAGNOSIS — Z30430 Encounter for insertion of intrauterine contraceptive device: Secondary | ICD-10-CM | POA: Diagnosis not present

## 2022-10-07 DIAGNOSIS — D219 Benign neoplasm of connective and other soft tissue, unspecified: Secondary | ICD-10-CM

## 2022-10-07 DIAGNOSIS — Z308 Encounter for other contraceptive management: Secondary | ICD-10-CM

## 2022-10-07 DIAGNOSIS — Z01812 Encounter for preprocedural laboratory examination: Secondary | ICD-10-CM

## 2022-10-07 LAB — PREGNANCY, URINE: Preg Test, Ur: NEGATIVE

## 2022-10-07 MED ORDER — LEVONORGESTREL 20 MCG/DAY IU IUD
1.0000 | INTRAUTERINE_SYSTEM | Freq: Once | INTRAUTERINE | Status: AC
Start: 2022-10-07 — End: 2022-10-07
  Administered 2022-10-07: 1 via INTRAUTERINE

## 2022-10-07 NOTE — Addendum Note (Signed)
Addended by: Eliezer Bottom on: 10/07/2022 09:27 AM   Modules accepted: Orders

## 2022-10-07 NOTE — Progress Notes (Signed)
Lisa Villarreal [birth year] [id number]        47 y.o.  [id number]   RP: Mirena IUD insertion for cycle control and contraception  HPI: Patient seen on 09/25/22 for Pelvic US.  Endometrial line normal.  Was well on LoEstrin Fe 1/20 with menses every month, until she missed a pill.  BTB every day for 2 weeks.  Occasionally misses a pill, would like to change contraceptive to Progesterone IUD.  Not sexually active currently.  No pelvic pain. No need for STI screen.    OB History  Gravida Para Term Preterm AB Living  4 3 3  0 1 3  SAB IAB Ectopic Multiple Live Births  1 0 0 0      # Outcome Date GA Lbr Len/2nd Weight Sex Delivery Anes PTL Lv  4 Term 2005     Vag-Spont     3 Term 2003     Vag-Spont     2 Term 1999     CS-Classical     1 SAB             Past medical history,surgical history, problem list, medications, allergies, family history and social history were all reviewed and documented in the EPIC chart.   Directed ROS with pertinent positives and negatives documented in the history of present illness/assessment and plan.  Exam:  Vitals:   10/07/22 0837  BP: 112/62  Pulse: 74  SpO2: 99%   General appearance:  Normal                                   IUD procedure note       Patient presented to the office today for placement of Mirena IUD. The patient had previously been provided with literature information on this method of contraception. The risks benefits and pros and cons were discussed and all her questions were answered. She is fully aware that this form of contraception is 99% effective and is good for 8 years.  Pelvic US 09/25/22: Comparison is made with previous scan December 2022.  T/V images.  Anteverted mildly enlarged uterus with fibroids again noted.  The overall uterine size is measured at 9.47 x 9.26 x 5.67 cm.  No change in the largest fibroid which is measured at 6 x 4 cm and is located to the left side of the uterus.  2 new smaller fibroids are  identified, both below 2 cm.  The endometrial lining is symmetrical and thinner than on the previous scan, measured at 6.4 mm with no mass or thickening seen.  Right ovary is small with no visible follicles.  Left ovary with a single simple follicle measured at 2.3 cm.  No adnexal mass.  No free fluid in the pelvis.    The cervix was cleansed with Betadine solution. Hurricane spray on the cervix.  A single-tooth tenaculum was placed on the anterior cervical lip. Hysterometry with the Os Finder was 8 cm. The IUD was shown to the patient and inserted in a sterile fashion. The IUD string was trimmed. The single-tooth tenaculum was removed. Patient was instructed to return back to the office in one month for follow up.        Assessment/Plan:  47 y.o. [id number]   1. Encounter for IUD insertion Patient seen on 09/25/22 for Pelvic US.  Endometrial line normal.  Was well on LoEstrin Fe 1/20 with menses every month, until she  missed a pill.  BTB every day for 2 weeks.  Occasionally misses a pill, would like to change contraceptive to Progesterone IUD.  Not sexually active currently.  No pelvic pain. No need for STI screen.  IUD Benefits/Risks/Insertion procedure explained, all questions answered.  Easy insertion, well tolerated, no Cx.  Post procedure precautions reviewed.  F/U in 4 weeks for IUD check.  2. Breakthrough bleeding on birth control pills As above.  3. Fibroids As above.  4. Encounter for preprocedural laboratory examination UPT Neg. - Pregnancy, urine   Genia Del MD, 8:47 AM 10/07/2022

## 2022-11-04 ENCOUNTER — Encounter: Payer: Self-pay | Admitting: Obstetrics & Gynecology

## 2022-11-04 ENCOUNTER — Ambulatory Visit (INDEPENDENT_AMBULATORY_CARE_PROVIDER_SITE_OTHER): Payer: 59 | Admitting: Obstetrics & Gynecology

## 2022-11-04 VITALS — BP 120/80 | HR 70

## 2022-11-04 DIAGNOSIS — Z30431 Encounter for routine checking of intrauterine contraceptive device: Secondary | ICD-10-CM

## 2022-11-04 NOTE — Progress Notes (Signed)
    Lisa Villarreal 03-18-1976 [id number]        47 y.o.  [id number] Married  RP: Mirena IUD check 4 weeks post insertion  HPI: Doing very well post Mirena IUD insertion on 10/07/22.  Less and less vaginal spotting, currently secretions tinted light brown.  No pelvic pain or cramping.  No pain with IC.  No fever.   OB History  Gravida Para Term Preterm AB Living  4 3 3  0 1 3  SAB IAB Ectopic Multiple Live Births  1 0 0 0      # Outcome Date GA Lbr Len/2nd Weight Sex Delivery Anes PTL Lv  4 Term 2005     Vag-Spont     3 Term 2003     Vag-Spont     2 Term 1999     CS-Classical     1 SAB             Past medical history,surgical history, problem list, medications, allergies, family history and social history were all reviewed and documented in the EPIC chart.   Directed ROS with pertinent positives and negatives documented in the history of present illness/assessment and plan.  Exam:  Vitals:   11/04/22 0758  BP: 120/80  Pulse: 70  SpO2: 99%   General appearance:  Normal   Gynecologic exam: Vulva: Normal.  Speculum:  Cervix normal, no erythema, IUD strings visible at EO.  Very mild brownish secretions.     Assessment/Plan:  47 y.o. [id number]   1. Encounter for routine checking of intrauterine contraceptive device (IUD)   Doing very well post Mirena IUD insertion on 10/07/22.  Less and less vaginal spotting, currently secretions tinted light brown.  No pelvic pain or cramping.  No pain with IC.  No fever. Mirena IUD well tolerated, in good position with no sign of infection.  Patient reassured.  F/U for Annual Gyn exam in 03/2023.  Genia Del MD, 8:10 AM 11/04/2022

## 2022-11-13 ENCOUNTER — Ambulatory Visit
Admission: EM | Admit: 2022-11-13 | Discharge: 2022-11-13 | Disposition: A | Discharge status: Home / Self Care | Payer: 59 | Attending: Family Medicine | Admitting: Family Medicine

## 2022-11-13 DIAGNOSIS — U071 COVID-19: Secondary | ICD-10-CM | POA: Diagnosis not present

## 2022-11-13 DIAGNOSIS — J069 Acute upper respiratory infection, unspecified: Secondary | ICD-10-CM | POA: Diagnosis not present

## 2022-11-13 MED ORDER — PROMETHAZINE-DM 6.25-15 MG/5ML PO SYRP: 5.0000 mL | ORAL_SOLUTION | Freq: Four times a day (QID) | ORAL | 0 refills | Status: DC | PRN

## 2022-11-13 NOTE — ED Triage Notes (Signed)
Patient presents to UC for chills, cough, nasal congestion since Monday. Diarrhea since today. Treating with OTC cold med XL-3, dayquil today.

## 2022-11-13 NOTE — Discharge Instructions (Signed)
Take Phenergan with dextromethorphan syrup--5 mL or 1 teaspoon every 6 hours as needed for cough  (Tome Ud 5 ml por la boca cada 6 horas cuando tiene tos)   You can take Imodium as needed for the diarrhea.  This you can purchase over-the-counter (Ud puede comprar imodium de la botica, y tome Ud Imodium para la diarrhea)  Make sure you are getting enough fluids in (por favor, tome suficiente liquidos orales)   You have been swabbed for COVID, and the test will result in the next 24 hours. Our staff will call you if positive. If the COVID test is positive, you should quarantine until you are fever free for 24 hours and you are starting to feel better, and then take added precautions for the next 5 days, such as physical distancing/wearing a mask and good hand hygiene/washing (Hemos hecho una prueba de COVID. Si es positiva, las enfermeras le hablarian a decirle. Si es positiva, Ud debe hacer cuarentena por 5 dias del primer dia que Ud tuvo las sintomas)

## 2022-11-13 NOTE — ED Provider Notes (Signed)
[facility]    CSN: [id number] Arrival date & time: 11/13/22  1152      History   Chief Complaint Chief Complaint  Patient presents with   Nasal Congestion    HPI Lisa Villarreal is a 47 y.o. female.   HPI Here for cough, nasal congestion and rhinorrhea, sore throat, headache.  Symptoms began on July 8.  This morning she developed diarrhea and has had 8 episodes of diarrhea.  She states Pepto-Bismol has not helped.  There is been subjective fever today, but we did not measure any at triage.  She has had some chills.  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 Authorizing Provider  promethazine-dextromethorphan (PROMETHAZINE-DM) 6.25-15 MG/5ML syrup Take 5 mLs by mouth 4 (four) times daily as needed for cough. 11/13/22  Yes Zenia Resides, MD  levonorgestrel (MIRENA) 20 MCG/DAY IUD 1 each by Intrauterine route once. 10/07/22  10/07/30  [provider]  Multiple Vitamin (MULTIVITAMIN PO) Take by mouth.    [provider]    Family History Family History  Problem Relation Age of Onset   Heart disease Mother        pacemarker in place   Leukemia Maternal Aunt     Social History Social History   Tobacco Use   Smoking status: Never   Smokeless tobacco: Never  Vaping Use   Vaping status: Never Used  Substance Use Topics   Alcohol use: No   Drug use: No     Allergies   Penicillins   Review of Systems Review of Systems   Physical Exam Triage Vital Signs ED Triage Vitals [11/13/22 1230]  Encounter Vitals Group     BP 112/73     Systolic BP Percentile      Diastolic BP Percentile      Pulse Rate 90     Resp 16     Temp 98.9 F (37.2 C)     Temp Source Oral     SpO2 98 %     Weight  Height      Head Circumference      Peak Flow      Pain Score      Pain Loc      Pain Education      Exclude from Growth Chart    No data found.  Updated Vital Signs BP 112/73 (BP Location: Left Arm)   Pulse 90   Temp 98.9 F (37.2 C) (Oral)   Resp 16   SpO2 98%   Visual Acuity Right Eye Distance:   Left Eye Distance:   Bilateral Distance:    Right Eye Near:   Left Eye Near:    Bilateral Near:     Physical Exam Vitals reviewed.  Constitutional:      General: She is not in acute distress.    Appearance: She is not ill-appearing, toxic-appearing or diaphoretic.  HENT:     Right Ear: Tympanic membrane and ear canal normal.     Left Ear: Tympanic membrane and ear canal normal.     Nose: Congestion present.     Mouth/Throat:     Mouth: Mucous membranes are moist.     Comments: There is very mild erythema of the posterior oropharynx with lymphoid hypertrophy. Eyes:     Extraocular Movements: Extraocular movements intact.     Conjunctiva/sclera: Conjunctivae normal.     Pupils: Pupils are equal, round, and reactive to light.  Cardiovascular:     Rate and Rhythm:  Normal rate and regular rhythm.     Heart sounds: No murmur heard. Pulmonary:     Effort: Pulmonary effort is normal. No respiratory distress.     Breath sounds: No stridor. No wheezing, rhonchi or rales.  Abdominal:     Palpations: Abdomen is soft.     Tenderness: There is no abdominal tenderness.  Musculoskeletal:     Cervical back: Neck supple.  Lymphadenopathy:     Cervical: No cervical adenopathy.  Skin:    Coloration: Skin is not jaundiced or pale.  Neurological:     General: No focal deficit present.     Mental Status: She is alert and oriented to person, place, and time.  Psychiatric:        Behavior: Behavior normal.      UC Treatments / Results  Labs (all labs ordered are listed, but only abnormal results are displayed) Labs Reviewed  SARS CORONAVIRUS 2 (TAT 6-24 HRS)    EKG   Radiology No results found.  Procedures Procedures (including critical care time)  Medications Ordered in UC Medications - No data to display  Initial Impression / Assessment and Plan / UC Course  I have reviewed the triage vital signs and the nursing notes.  Pertinent labs & imaging results that were available during my care of the patient were reviewed by me and considered in my medical decision making (see chart for details).        COVID swab is done, and we will notify her if positive, so she will know she needs to isolate  Phenergan with dextromethorphan is sent in for the cough and I have asked her to take Imodium as needed for the diarrhea.  Final Clinical Impressions(s) / UC Diagnoses   Final diagnoses:  Viral URI with cough     Discharge Instructions      Take Phenergan with dextromethorphan syrup--5 mL or 1 teaspoon every 6 hours as needed for cough  (Tome Ud 5 ml por la boca cada 6 horas cuando tiene tos)  You can take Imodium as needed for the diarrhea.  This you can purchase over-the-counter (Ud puede comprar imodium de la botica, y tome Ud Imodium  para la diarrhea)  Make sure you are getting enough fluids in (por favor, tome suficiente liquidos orales)   You have been swabbed for COVID, and the test will result in the next 24 hours. Our staff will call you if positive. If the COVID test is positive, you should quarantine until you are fever free for 24 hours and you are starting to feel better, and then take added precautions for the next 5 days, such as physical distancing/wearing a mask and good hand hygiene/washing (Hemos hecho una prueba de COVID. Si es positiva, las enfermeras le hablarian a decirle. Si es positiva, Ud debe Sales promotion account executive por 5 dias del primer dia que Ud tuvo las sintomas)       ED Prescriptions     Medication Sig Dispense Auth. Provider   promethazine-dextromethorphan (PROMETHAZINE-DM) 6.25-15 MG/5ML syrup Take 5 mLs by mouth 4 (four) times daily as needed for cough. 118 mL Zenia Resides, MD      PDMP not reviewed this encounter.   Zenia Resides, MD 11/13/22 [phone]

## 2022-11-14 LAB — SARS CORONAVIRUS 2 (TAT 6-24 HRS): SARS Coronavirus 2: POSITIVE — AB

## 2022-11-24 ENCOUNTER — Telehealth: Payer: Self-pay

## 2022-11-24 NOTE — Telephone Encounter (Signed)
Patient left message requesting a return call. Attempted to return patient's call, left message on voicemail, requesting a return call.

## 2022-12-01 ENCOUNTER — Other Ambulatory Visit (HOSPITAL_BASED_OUTPATIENT_CLINIC_OR_DEPARTMENT_OTHER): Payer: Self-pay | Admitting: Internal Medicine

## 2022-12-01 DIAGNOSIS — Z1231 Encounter for screening mammogram for malignant neoplasm of breast: Secondary | ICD-10-CM

## 2022-12-02 ENCOUNTER — Other Ambulatory Visit: Payer: Self-pay

## 2022-12-02 DIAGNOSIS — R921 Mammographic calcification found on diagnostic imaging of breast: Secondary | ICD-10-CM

## 2022-12-03 ENCOUNTER — Encounter (INDEPENDENT_AMBULATORY_CARE_PROVIDER_SITE_OTHER): Payer: Self-pay

## 2022-12-04 ENCOUNTER — Ambulatory Visit (HOSPITAL_BASED_OUTPATIENT_CLINIC_OR_DEPARTMENT_OTHER): Admission: RE | Admit: 2022-12-04 | Payer: 59 | Source: Ambulatory Visit | Admitting: Radiology

## 2022-12-23 ENCOUNTER — Ambulatory Visit: Payer: Self-pay | Admitting: Hematology and Oncology

## 2022-12-23 ENCOUNTER — Ambulatory Visit: Admission: RE | Admit: 2022-12-23 | Payer: 59 | Source: Ambulatory Visit

## 2022-12-23 ENCOUNTER — Other Ambulatory Visit: Payer: Self-pay | Admitting: Obstetrics and Gynecology

## 2022-12-23 ENCOUNTER — Ambulatory Visit
Admission: RE | Admit: 2022-12-23 | Discharge: 2022-12-23 | Disposition: A | Discharge status: Home / Self Care | Payer: No Typology Code available for payment source | Source: Ambulatory Visit | Attending: Obstetrics and Gynecology | Admitting: Obstetrics and Gynecology

## 2022-12-23 VITALS — BP 119/63 | Wt 118.0 lb

## 2022-12-23 DIAGNOSIS — R9230 Dense breasts, unspecified: Secondary | ICD-10-CM

## 2022-12-23 DIAGNOSIS — R921 Mammographic calcification found on diagnostic imaging of breast: Secondary | ICD-10-CM

## 2022-12-23 DIAGNOSIS — R2232 Localized swelling, mass and lump, left upper limb: Secondary | ICD-10-CM

## 2022-12-23 NOTE — Progress Notes (Signed)
[name] is a 47 y.o. female who presents to [facility] clinic today with no complaints. Follow up stable, likely benign right breast calcifications.   Pap Smear: Pap not smear completed today. Last Pap smear was 03/06/2021 and was normal. Per patient has no history of an abnormal Pap smear. Last Pap smear result is available in Epic.   Physical exam: Breasts Breasts symmetrical. No skin abnormalities bilateral breasts. No nipple retraction bilateral breasts. No nipple discharge bilateral breasts. No lymphadenopathy. No lumps palpated bilateral breasts. MM DIAG BREAST TOMO UNI RIGHT  Result Date: 06/30/2022 CLINICAL DATA:  47 year old female presenting for follow-up of likely benign right breast calcifications. EXAM: DIGITAL DIAGNOSTIC UNILATERAL RIGHT MAMMOGRAM WITH TOMOSYNTHESIS TECHNIQUE: Right digital diagnostic mammography and breast tomosynthesis was performed. COMPARISON:  Previous exam(s). ACR Breast Density Category c: The breasts are heterogeneously dense, which may obscure small masses. FINDINGS: The 4 mm group of calcifications in the lateral mid depth the right breast are mammographically stable. No new suspicious calcifications, masses or areas of distortion are seen in the right breast. IMPRESSION: Stable likely benign right breast calcifications. RECOMMENDATION: Bilateral diagnostic mammogram in August of 2024. I have discussed the findings and recommendations with the patient. If applicable, a reminder letter will be sent to the patient regarding the next appointment. BI-RADS CATEGORY  3: Probably benign. Electronically Signed   By: Frederico Hamman M.D.   On: 06/30/2022 08:28  MM DIAG BREAST TOMO BILATERAL  Result Date: 12/26/2021 CLINICAL DATA:  47 year old female for further evaluation of RIGHT breast calcifications and possible LEFT breast mass on screening mammogram. Patient also now complains of palpable thickening within the UPPER-OUTER LEFT breast. EXAM: DIGITAL  DIAGNOSTIC BILATERAL MAMMOGRAM WITH TOMOSYNTHESIS; ULTRASOUND LEFT BREAST LIMITED TECHNIQUE: Bilateral digital diagnostic mammography and breast tomosynthesis was performed.; Targeted ultrasound examination of the left breast was performed. COMPARISON:  Previous exam(s). ACR Breast Density Category c: The breast tissue is heterogeneously dense, which may obscure small masses. FINDINGS: Full field views of both breasts, magnification views of the RIGHT breast and spot compression views of the LEFT breast demonstrate a 0.4 cm group of calcifications within the OUTER RIGHT breast, middle depth, which appear to layer on the full field LATERAL views. A persistent circumscribed oval mass within the central LEFT breast is identified. Targeted ultrasound is performed, showing a 0.9 x 0.7 x 1 cm benign cyst at the 3 o'clock position of the LEFT breast 1 cm from the nipple the, corresponding to the screening study finding. No sonographic abnormalities identified within the UPPER-OUTER LEFT breast, at the site of patient concern. IMPRESSION: 1. Likely benign 0.4 cm group of OUTER RIGHT breast calcifications, likely milk of calcium. Six-month follow-up recommended to ensure stability. 2. Benign cyst within the OUTER central LEFT breast corresponding to the screening study finding. RECOMMENDATION: RIGHT diagnostic mammogram with magnification views in 6 months. I have discussed the findings and recommendations with the patient. If applicable, a reminder letter will be sent to the patient regarding the next appointment. BI-RADS CATEGORY  3: Probably benign. Electronically Signed   By: Harmon Pier M.D.   On: 12/26/2021 09:41  MM 3D SCREEN BREAST BILATERAL  Result Date: 12/03/2021 CLINICAL DATA:  Screening. EXAM: DIGITAL SCREENING BILATERAL MAMMOGRAM WITH TOMOSYNTHESIS AND CAD TECHNIQUE: Bilateral screening digital craniocaudal and mediolateral oblique mammograms were obtained. Bilateral screening digital breast tomosynthesis  was performed. The images were evaluated with computer-aided detection. COMPARISON:  Previous exam(s). ACR Breast Density Category c: The breast tissue is heterogeneously dense,  which may obscure small masses. FINDINGS: In the right breast calcifications require further evaluation. In the left breast possible mass requires further evaluation. IMPRESSION: Further evaluation is suggested for calcifications in the right breast. Further evaluation is suggested for possible mass in the left breast. RECOMMENDATION: Diagnostic mammogram and possibly ultrasound of both breasts. (Code:FI-B-26M) The patient will be contacted regarding the findings, and additional imaging will be scheduled. BI-RADS CATEGORY  0: Incomplete. Need additional imaging evaluation and/or prior mammograms for comparison. Electronically Signed   By: Norva Pavlov M.D.   On: 12/03/2021 13:18   MM 3D SCREEN BREAST BILATERAL  Result Date: 11/30/2020 CLINICAL DATA:  Screening. EXAM: DIGITAL SCREENING BILATERAL MAMMOGRAM WITH TOMOSYNTHESIS AND CAD TECHNIQUE: Bilateral screening digital craniocaudal and mediolateral oblique mammograms were obtained. Bilateral screening digital breast tomosynthesis was performed. The images were evaluated with computer-aided detection. COMPARISON:  Previous exam(s). ACR Breast Density Category c: The breast tissue is heterogeneously dense, which may obscure small masses. FINDINGS: There are no findings suspicious for malignancy. IMPRESSION: No mammographic evidence of malignancy. A result letter of this screening mammogram will be mailed directly to the patient. RECOMMENDATION: Screening mammogram in one year. (Code:SM-B-01Y) BI-RADS CATEGORY  1: Negative. Electronically Signed   By: Ted Mcalpine M.D.   On: 11/30/2020 16:38   MM 3D SCREEN BREAST BILATERAL  Result Date: 11/09/2019 CLINICAL DATA:  Screening. EXAM: DIGITAL SCREENING BILATERAL MAMMOGRAM WITH TOMO AND CAD COMPARISON:  Previous exam(s). ACR Breast  Density Category c: The breast tissue is heterogeneously dense, which may obscure small masses. FINDINGS: There are no findings suspicious for malignancy. Images were processed with CAD. IMPRESSION: No mammographic evidence of malignancy. A result letter of this screening mammogram will be mailed directly to the patient. RECOMMENDATION: Screening mammogram in one year. (Code:SM-B-01Y) BI-RADS CATEGORY  1: Negative. Electronically Signed   By: Annia Belt M.D.   On: 11/09/2019 07:38   MS DIGITAL SCREENING TOMO BILATERAL  Result Date: 09/30/2018 CLINICAL DATA:  Screening. EXAM: DIGITAL SCREENING BILATERAL MAMMOGRAM WITH TOMO AND CAD COMPARISON:  Previous exam(s). ACR Breast Density Category c: The breast tissue is heterogeneously dense, which may obscure small masses. FINDINGS: There are no findings suspicious for malignancy. Images were processed with CAD. IMPRESSION: No mammographic evidence of malignancy. A result letter of this screening mammogram will be mailed directly to the patient. RECOMMENDATION: Screening mammogram in one year. (Code:SM-B-01Y) BI-RADS CATEGORY  1: Negative. Electronically Signed   By: Amie Portland M.D.   On: 09/30/2018 10:36          Pelvic/Bimanual Pap is not indicated today    Smoking History: Patient has never smoked and was not referred to quit line.    Patient Navigation: Patient education provided. Access to services provided for patient through [facility] program. Natale Lay interpreter provided. No transportation provided   Colorectal Cancer Screening: Per patient has had colonoscopy completed on 04/17/2021 with results negative.  No complaints today. Will follow up in 7 years.    Breast and Cervical Cancer Risk Assessment: Patient does not have family history of breast cancer, known genetic mutations, or radiation treatment to the chest before age 60. Patient does not have history of cervical dysplasia, immunocompromised, or DES exposure in-utero.  Risk  Assessment   No risk assessment data     A: [facility] exam without pap smear No complaints with benign exam.  P: [facility] for a diagnostic mammogram. Appointment scheduled 12/23/22.  Pascal Lux, NP 12/23/2022 10:30  AM

## 2022-12-23 NOTE — Patient Instructions (Signed)
Taught [name] about self breast awareness and gave educational materials to take home. Patient did not need a Pap smear today due to last Pap smear was in 03/06/2021 per patient.  Let her know [facility] will cover Pap smears every 5 years unless has a history of abnormal Pap smears. Referred patient to the [facility] for diagnostic mammogram. Appointment scheduled for 12/23/22. Patient aware of appointment and will be there. Let patient know will follow up with her within the next couple weeks with results. [name] verbalized understanding.  Pascal Lux, NP 10:56 AM

## 2022-12-25 ENCOUNTER — Ambulatory Visit
Admission: RE | Admit: 2022-12-25 | Discharge: 2022-12-25 | Disposition: A | Discharge status: Home / Self Care | Payer: 59 | Source: Ambulatory Visit | Attending: Obstetrics and Gynecology | Admitting: Obstetrics and Gynecology

## 2022-12-25 ENCOUNTER — Encounter: Payer: 59 | Admitting: Internal Medicine

## 2022-12-25 DIAGNOSIS — R9230 Dense breasts, unspecified: Secondary | ICD-10-CM

## 2022-12-25 DIAGNOSIS — R2232 Localized swelling, mass and lump, left upper limb: Secondary | ICD-10-CM

## 2022-12-31 ENCOUNTER — Encounter: Payer: Self-pay | Admitting: Internal Medicine

## 2022-12-31 ENCOUNTER — Ambulatory Visit (INDEPENDENT_AMBULATORY_CARE_PROVIDER_SITE_OTHER): Payer: 59 | Admitting: Internal Medicine

## 2022-12-31 VITALS — BP 110/78 | HR 70 | Temp 98.5°F | Ht 58.75 in | Wt 117.6 lb

## 2022-12-31 DIAGNOSIS — Z1159 Encounter for screening for other viral diseases: Secondary | ICD-10-CM

## 2022-12-31 DIAGNOSIS — Z23 Encounter for immunization: Secondary | ICD-10-CM

## 2022-12-31 DIAGNOSIS — D509 Iron deficiency anemia, unspecified: Secondary | ICD-10-CM | POA: Diagnosis not present

## 2022-12-31 DIAGNOSIS — Z0000 Encounter for general adult medical examination without abnormal findings: Secondary | ICD-10-CM | POA: Diagnosis not present

## 2022-12-31 DIAGNOSIS — G4700 Insomnia, unspecified: Secondary | ICD-10-CM | POA: Diagnosis not present

## 2022-12-31 DIAGNOSIS — E559 Vitamin D deficiency, unspecified: Secondary | ICD-10-CM

## 2022-12-31 NOTE — Progress Notes (Signed)
Established Patient Office Visit     CC/Reason for Visit: Annual preventive exam and discuss insomnia  HPI: Lisa Villarreal is a 47 y.o. female who is coming in today for the above mentioned reasons. Past Medical History is significant for: Vitamin D deficiency and iron deficiency anemia.  Has been feeling well other than insomnia.  She goes to bed at around 9-10 o'clock but wakes up at around 3-4 and has difficulty falling back asleep.  She is overdue for eye care, she has routine dental care.  She is requesting a flu vaccine.  Mammogram colonoscopy and Pap smear are up-to-date.   Past Medical/Surgical History: Past Medical History:  Diagnosis Date   Anemia    Anxiety    Headache, migraine    resolved, none for years   Uterine cyst    Vitamin D deficiency     Past Surgical History:  Procedure Laterality Date   CESAREAN SECTION     INTRAUTERINE DEVICE (IUD) INSERTION     mirena inserted 10-07-22    Social History:  reports that she has never smoked. She has never used smokeless tobacco. She reports that she does not drink alcohol and does not use drugs.  Allergies: Allergies  Allergen Reactions   Penicillins     REACTION: Swelling    Family History:  Family History  Problem Relation Age of Onset   Heart disease Mother        pacemarker in place   Leukemia Maternal Aunt      Current Outpatient Medications:    levonorgestrel (MIRENA) 20 MCG/DAY IUD, 1 each by Intrauterine route once., Disp: , Rfl:    Multiple Vitamin (MULTIVITAMIN PO), Take by mouth., Disp: , Rfl:   Review of Systems:  Negative unless indicated in HPI.   Physical Exam: Vitals:   12/31/22 1318  BP: 110/78  Pulse: 70  Temp: 98.5 F (36.9 C)  TempSrc: Oral  SpO2: 98%  Weight: 117 lb 9.6 oz (53.3 kg)  Height: 4' 10.75" (1.492 m)    Body mass index is 23.95 kg/m.   Physical Exam Vitals reviewed.  Constitutional:      General: She is not in acute distress.     Appearance: Normal appearance. She is not ill-appearing, toxic-appearing or diaphoretic.  HENT:     Head: Normocephalic.     Right Ear: Tympanic membrane, ear canal and external ear normal. There is no impacted cerumen.     Left Ear: Tympanic membrane, ear canal and external ear normal. There is no impacted cerumen.     Nose: Nose normal.     Mouth/Throat:     Mouth: Mucous membranes are moist.     Pharynx: Oropharynx is clear. No oropharyngeal exudate or posterior oropharyngeal erythema.  Eyes:     General: No scleral icterus.       Right eye: No discharge.        Left eye: No discharge.     Conjunctiva/sclera: Conjunctivae normal.     Pupils: Pupils are equal, round, and reactive to light.  Neck:     Vascular: No carotid bruit.  Cardiovascular:     Rate and Rhythm: Normal rate and regular rhythm.     Pulses: Normal pulses.     Heart sounds: Normal heart sounds.  Pulmonary:     Effort: Pulmonary effort is normal. No respiratory distress.     Breath sounds: Normal breath sounds.  Abdominal:     General: Abdomen is flat. Bowel sounds  are normal.     Palpations: Abdomen is soft.  Musculoskeletal:        General: Normal range of motion.     Cervical back: Normal range of motion.  Skin:    General: Skin is warm and dry.  Neurological:     General: No focal deficit present.     Mental Status: She is alert and oriented to person, place, and time. Mental status is at baseline.  Psychiatric:        Mood and Affect: Mood normal.        Behavior: Behavior normal.        Thought Content: Thought content normal.        Judgment: Judgment normal.     Flowsheet Row Office Visit from 12/31/2022 in [facility] [place]  PHQ-9 Total Score 1       Impression and Plan:  Encounter for preventive health examination -     Comprehensive metabolic panel; Future -     Lipid panel; Future -     Vitamin B12; Future  Vitamin D deficiency -     VITAMIN D 25 Hydroxy  (Vit-D Deficiency, Fractures); Future  Iron deficiency anemia, unspecified iron deficiency anemia type -     CBC with Differential/Platelet; Future  Needs flu shot -     Flu vaccine trivalent PF, 6mos and older(Flulaval,Afluria,Fluarix,Fluzone)  Insomnia, unspecified type  Encounter for hepatitis C screening test for low risk patient -     Hepatitis C antibody; Future  -Recommend routine eye and dental care. -Healthy lifestyle discussed in detail. -Labs to be updated today. -Prostate cancer screening: N/A Health Maintenance  Topic Date Due   Hepatitis C Screening  Never done   COVID-19 Vaccine (4 - 2023-24 season) 01/16/2023*   Pap Smear  03/06/2024   DTaP/Tdap/Td vaccine (2 - Td or Tdap) 01/25/2025   Colon Cancer Screening  04/18/2031   Flu Shot  Completed   HIV Screening  Completed   HPV Vaccine  Aged Out  *Topic was postponed. The date shown is not the original due date.     -Flu vaccine in office today.  Advised to update COVID at pharmacy. -For insomnia we discussed no caffeine after 3 PM, no fluids 2 hours before bedtime, she will try natural resources like melatonin and magnesium citrate.    Chaya Jan, MD [facility]

## 2023-01-01 ENCOUNTER — Other Ambulatory Visit (INDEPENDENT_AMBULATORY_CARE_PROVIDER_SITE_OTHER): Payer: 59

## 2023-01-01 DIAGNOSIS — E559 Vitamin D deficiency, unspecified: Secondary | ICD-10-CM | POA: Diagnosis not present

## 2023-01-01 DIAGNOSIS — D509 Iron deficiency anemia, unspecified: Secondary | ICD-10-CM | POA: Diagnosis not present

## 2023-01-01 DIAGNOSIS — Z0000 Encounter for general adult medical examination without abnormal findings: Secondary | ICD-10-CM

## 2023-01-01 DIAGNOSIS — Z1159 Encounter for screening for other viral diseases: Secondary | ICD-10-CM

## 2023-01-01 LAB — COMPREHENSIVE METABOLIC PANEL
ALT: 16 U/L (ref 0–35)
AST: 18 U/L (ref 0–37)
Albumin: 4.3 g/dL (ref 3.5–5.2)
Alkaline Phosphatase: 50 U/L (ref 39–117)
BUN: 12 mg/dL (ref 6–23)
CO2: 25 mEq/L (ref 19–32)
Calcium: 9.2 mg/dL (ref 8.4–10.5)
Chloride: 103 mEq/L (ref 96–112)
Creatinine, Ser: 0.72 mg/dL (ref 0.40–1.20)
GFR: 99.12 mL/min (ref >60.00)
Glucose, Bld: 96 mg/dL (ref 70–99)
Potassium: 4 mEq/L (ref 3.5–5.1)
Sodium: 135 mEq/L (ref 135–145)
Total Bilirubin: 0.9 mg/dL (ref 0.2–1.2)
Total Protein: 7.2 g/dL (ref 6.0–8.3)

## 2023-01-01 LAB — CBC WITH DIFFERENTIAL/PLATELET
Basophils Absolute: 0 10*3/uL (ref 0.0–0.1)
Basophils Relative: 0.4 % (ref 0.0–3.0)
Eosinophils Absolute: 0.2 10*3/uL (ref 0.0–0.7)
Eosinophils Relative: 3.1 % (ref 0.0–5.0)
HCT: 39.5 % (ref 36.0–46.0)
Hemoglobin: 13.1 g/dL (ref 12.0–15.0)
Lymphocytes Relative: 20.8 % (ref 12.0–46.0)
Lymphs Abs: 1.5 10*3/uL (ref 0.7–4.0)
MCHC: 33 g/dL (ref 30.0–36.0)
MCV: 87.3 fl (ref 78.0–100.0)
Monocytes Absolute: 0.3 10*3/uL (ref 0.1–1.0)
Monocytes Relative: 4.4 % (ref 3.0–12.0)
Neutro Abs: 5.1 10*3/uL (ref 1.4–7.7)
Neutrophils Relative %: 71.3 % (ref 43.0–77.0)
Platelets: 299 10*3/uL (ref 150.0–400.0)
RBC: 4.53 Mil/uL (ref 3.87–5.11)
RDW: 14 % (ref 11.5–15.5)
WBC: 7.2 10*3/uL (ref 4.0–10.5)

## 2023-01-01 LAB — LIPID PANEL
Cholesterol: 151 mg/dL (ref 0–200)
HDL: 57.8 mg/dL (ref >39.00)
LDL Cholesterol: 78 mg/dL (ref 0–99)
NonHDL: 93.54
Total CHOL/HDL Ratio: 3
Triglycerides: 78 mg/dL (ref 0.0–149.0)
VLDL: 15.6 mg/dL (ref 0.0–40.0)

## 2023-01-01 LAB — VITAMIN B12: Vitamin B-12: 299 pg/mL (ref 211–911)

## 2023-01-01 LAB — VITAMIN D 25 HYDROXY (VIT D DEFICIENCY, FRACTURES): VITD: 30.83 ng/mL (ref 30.00–100.00)

## 2023-01-02 LAB — HEPATITIS C ANTIBODY: Hep C Virus Ab: NONREACTIVE

## 2023-01-26 ENCOUNTER — Telehealth: Payer: Self-pay | Admitting: Obstetrics and Gynecology

## 2023-01-26 ENCOUNTER — Ambulatory Visit: Payer: 59 | Admitting: Obstetrics and Gynecology

## 2023-01-26 ENCOUNTER — Other Ambulatory Visit (HOSPITAL_COMMUNITY)
Admission: RE | Admit: 2023-01-26 | Discharge: 2023-01-26 | Disposition: A | Discharge status: Home / Self Care | Payer: 59 | Source: Ambulatory Visit | Attending: Obstetrics and Gynecology | Admitting: Obstetrics and Gynecology

## 2023-01-26 ENCOUNTER — Encounter: Payer: Self-pay | Admitting: Obstetrics and Gynecology

## 2023-01-26 VITALS — BP 118/76 | HR 73 | Ht 58.47 in | Wt 117.4 lb

## 2023-01-26 DIAGNOSIS — Z975 Presence of (intrauterine) contraceptive device: Secondary | ICD-10-CM

## 2023-01-26 DIAGNOSIS — Z124 Encounter for screening for malignant neoplasm of cervix: Secondary | ICD-10-CM | POA: Diagnosis present

## 2023-01-26 DIAGNOSIS — N930 Postcoital and contact bleeding: Secondary | ICD-10-CM

## 2023-01-26 DIAGNOSIS — D219 Benign neoplasm of connective and other soft tissue, unspecified: Secondary | ICD-10-CM

## 2023-01-26 NOTE — Progress Notes (Signed)
GYNECOLOGY  VISIT   HPI: 47 y.o.   Married  Hispanic or Latino  female   [phone] with No LMP recorded. (Menstrual status: IUD).   here for   bleeding after intercourse for the last few weeks.  Some dryness with intercourse.  No change in partner.   Some hot flashes, once a day and at night.    Prefers a natural approach to menopause.   Mirena IUD placed 10/07/22. No spotting, only with intercourse.   Hx fibroids.  Uterus 10 week size.  Larges 6.3 cm.  She is due for a follow up US in November per patient.   Benign breast biopsy 12/25/22.  GYNECOLOGIC HISTORY: No LMP recorded. (Menstrual status: IUD). Contraception:  mirena iud inserted 6/22 Menopausal hormone therapy:  none Last mammogram:  12/23/2022 Last pap smear:   03-06-21 neg        OB History     Gravida  4   Para  3   Term  3   Preterm  0   AB  1   Living  3      SAB  1   IAB  0   Ectopic  0   Multiple  0   Live Births                 Patient Active Problem List   Diagnosis Date Noted   Vitamin D deficiency 03/19/2017   Iron deficiency anemia 03/19/2017   Simple cyst of right breast 03/19/2017   Upper back pain 08/16/2014   Nevus of left hand 11/15/2012   Hypopigmentation of bilateral forearms 11/17/2011   Health maintenance examination 10/24/2010   GENERALIZED ANXIETY DISORDER 08/28/2008    Past Medical History:  Diagnosis Date   Anemia    Anxiety    Headache, migraine    resolved, none for years   Uterine cyst    Vitamin D deficiency     Past Surgical History:  Procedure Laterality Date   CESAREAN SECTION     INTRAUTERINE DEVICE (IUD) INSERTION     mirena inserted 10-07-22    Current Outpatient Medications  Medication Sig Dispense Refill   levonorgestrel (MIRENA) 20 MCG/DAY IUD 1 each by Intrauterine route once.     Multiple Vitamin (MULTIVITAMIN PO) Take by mouth.     No current facility-administered medications for this visit.     ALLERGIES: Penicillins  Family  History  Problem Relation Age of Onset   Heart disease Mother        pacemarker in place   Leukemia Maternal Aunt     Social History   Socioeconomic History   Marital status: Married    Spouse name: Not on file   Number of children: 3   Years of education: Not on file   Highest education level: Not on file  Occupational History   Not on file  Tobacco Use   Smoking status: Never   Smokeless tobacco: Never  Vaping Use   Vaping status: Never Used  Substance and Sexual Activity   Alcohol use: No   Drug use: No   Sexual activity: Yes    Partners: Male    Birth control/protection: I.U.D.    Comment: 1st intercourse- 20, partners-1  Other Topics Concern   Not on file  Social History Narrative   Financial assistance approved for 100% discount at [facility] and has [facility] card.   Social Determinants of Health   Financial Resource Strain: Not on file  Food Insecurity: No Food  Insecurity (12/23/2022)   Hunger Vital Sign    Worried About Running Out of Food in the Last Year: Never true    Ran Out of Food in the Last Year: Never true  Transportation Needs: No Transportation Needs (12/23/2022)   PRAPARE - Administrator, Civil Service (Medical): No    Lack of Transportation (Non-Medical): No  Physical Activity: Not on file  Stress: Not on file  Social Connections: Not on file  Intimate Partner Violence: Not on file    Review of Systems  See HPI.  PHYSICAL EXAMINATION:    BP 118/76   Pulse 73   Ht 4' 10.47" (1.485 m)   Wt 117 lb 6.4 oz (53.3 kg)   SpO2 99%   BMI 24.15 kg/m     General appearance: alert, cooperative and appears stated age   Pelvic: External genitalia:  no lesions              Urethra:  normal appearing urethra with no masses, tenderness or lesions              Bartholins and Skenes: normal                 Vagina: normal appearing vagina with normal color and discharge, no lesions              Cervix: no lesions.  IUD strings noted.  Small amount  of bleeding from the os.                 Bimanual Exam:  Uterus:  10 week size, non-tender              Adnexa: no mass, fullness, tenderness           Chaperone was present for exam:  B'Aisha B, CMA  ASSESSMENT  Post coital bleeding.  Menopausal symptoms.  Mirena IUD in placed.  Cervical cancer screening.  Fibroids.    PLAN  We discussed possible reasons for bleeding:  atrophy, IUD itself, fibroids.  We discussed lubricants for intercourse.  She declines vaginal estrogens.  She will try Estroven for her hot flashes.  Pap and HR HPV collected.  Will proceed with pelvic US.  Prefers afternoon appt.  After 12:00.  FU about 10 days after the appointment.   32 min  total time was spent for this patient encounter, including preparation, face-to-face counseling with the patient, coordination of care, and documentation of the encounter.

## 2023-01-26 NOTE — Telephone Encounter (Signed)
Patient prefers an appointment in the afternoons after 12:00.   She will also need an office visit with me about 10 days after her [place] appointment.

## 2023-01-26 NOTE — Telephone Encounter (Signed)
Please schedule a pelvic ultrasound for my patient.   She has post coital bleeding, a Mirena IUD, and fibroids.

## 2023-01-27 NOTE — Telephone Encounter (Signed)
Order placed for pt to receive her pelvic US at [facility]. They will call her to schedule but if she does not hear from them in 24-48 hours, can call them to schedule at [phone] opt 1, then opt 5. They have spanish interpreters available if needed.   Pt will need a f/u OV appt made w/ BS ( ) for 7-10 business days after her [place] is done.   Please advise her at your earliest convenience. Thanks.

## 2023-01-27 NOTE — Telephone Encounter (Signed)
Per CS: "Left detailed message informing patient."

## 2023-01-28 NOTE — Telephone Encounter (Signed)
[place] scheduled for 02/02/2023.   Msg sent to spanish pool to schedule f/u appt.

## 2023-01-29 ENCOUNTER — Encounter: Payer: Self-pay | Admitting: Obstetrics and Gynecology

## 2023-01-29 DIAGNOSIS — D219 Benign neoplasm of connective and other soft tissue, unspecified: Secondary | ICD-10-CM

## 2023-01-29 DIAGNOSIS — Z975 Presence of (intrauterine) contraceptive device: Secondary | ICD-10-CM

## 2023-01-29 DIAGNOSIS — N930 Postcoital and contact bleeding: Secondary | ICD-10-CM

## 2023-01-29 LAB — CYTOLOGY - PAP
Adequacy: ABSENT
Comment: NEGATIVE
Diagnosis: NEGATIVE
High risk HPV: NEGATIVE

## 2023-01-30 NOTE — Telephone Encounter (Signed)
Spoke with patient. Patient has spoken with her insurance provider and provided covered alternatives for PUS, requesting new order for PUS at [facility]. Order placed. Number provided to patient to call and schedule PUS. Patient will call to cancel PUS at [facility]. Patient verbalizes understanding and is appreciative of call.  Routing to provider for final review. Patient is agreeable to disposition. Will close encounter.

## 2023-02-02 ENCOUNTER — Other Ambulatory Visit: Payer: 59

## 2023-02-04 ENCOUNTER — Ambulatory Visit (HOSPITAL_COMMUNITY): Payer: 59

## 2023-02-05 ENCOUNTER — Ambulatory Visit (HOSPITAL_COMMUNITY)
Admission: RE | Admit: 2023-02-05 | Discharge: 2023-02-05 | Disposition: A | Discharge status: Home / Self Care | Payer: 59 | Source: Ambulatory Visit | Attending: Obstetrics and Gynecology | Admitting: Obstetrics and Gynecology

## 2023-02-05 DIAGNOSIS — D219 Benign neoplasm of connective and other soft tissue, unspecified: Secondary | ICD-10-CM | POA: Insufficient documentation

## 2023-02-05 DIAGNOSIS — N930 Postcoital and contact bleeding: Secondary | ICD-10-CM | POA: Diagnosis present

## 2023-02-05 DIAGNOSIS — Z975 Presence of (intrauterine) contraceptive device: Secondary | ICD-10-CM | POA: Diagnosis present

## 2023-02-12 NOTE — Telephone Encounter (Signed)
Encounter closed

## 2023-02-19 ENCOUNTER — Ambulatory Visit: Payer: 59 | Admitting: Obstetrics and Gynecology

## 2023-02-19 ENCOUNTER — Encounter: Payer: Self-pay | Admitting: Obstetrics and Gynecology

## 2023-02-19 VITALS — BP 124/72 | HR 72 | Ht 60.0 in | Wt 119.0 lb

## 2023-02-19 DIAGNOSIS — D259 Leiomyoma of uterus, unspecified: Secondary | ICD-10-CM | POA: Diagnosis not present

## 2023-02-19 DIAGNOSIS — N930 Postcoital and contact bleeding: Secondary | ICD-10-CM

## 2023-02-19 MED ORDER — DOXYCYCLINE HYCLATE 100 MG PO CAPS: 100.0000 mg | ORAL_CAPSULE | Freq: Two times a day (BID) | ORAL | 0 refills | Status: DC

## 2023-02-19 NOTE — Progress Notes (Signed)
GYNECOLOGY  VISIT   HPI: 47 y.o.   Married  Hispanic  female   [phone] with No LMP recorded. (Menstrual status: IUD).   here for   discuss U/S- wants to discuss the inability to see ovaries, endometriosis (how serious and next steps). She is worried her ovaries were not seen on the [place].  Interpretor Darien Ramus is present for the visit today.  Patient has know fibroids and has a Mirena IUD.  She has been experiencing postcoital bleeding.   Spotting and little discomfort with intercourse with using gel/. This is improved.  No heavy bleeding or significant pain.   Her pap showed no SIL, neg HR HPV, atrophy and inflammation.   Taking Estoven and getting good results.   Study Result  Narrative & Impression  CLINICAL DATA:  Post coital bleeding, uterine fibroids, IUD   EXAM: TRANSABDOMINAL AND TRANSVAGINAL ULTRASOUND OF PELVIS   TECHNIQUE: Both transabdominal and transvaginal ultrasound examinations of the pelvis were performed. Transabdominal technique was performed for global imaging of the pelvis including uterus, ovaries, adnexal regions, and pelvic cul-de-sac. It was necessary to proceed with endovaginal exam following the transabdominal exam to visualize the endometrium.   COMPARISON:  Outside hospital pelvic ultrasound dated 04/16/2021   FINDINGS: Uterus   Measurements: 8.7 x 6.0 x 8.7 cm = volume: 230 mL. Dominant 5.8 x 4.6 x 4.9 cm intramural fibroid in the anterior uterine fundus. Superimposed adenomyosis is possible.   Endometrium   Thickness: 2 mm.  IUD the uterine body.   Right ovary   Not discretely visualized.  No adnexal mass is seen.   Left ovary   Not discretely visualized.  No adnexal mass is seen.   Other findings   No abnormal free fluid.   IMPRESSION: IUD in the uterine body.   Dominant 5.8 cm intramural fibroid in the anterior uterine fundus. Superimposed adenomyosis is possible.   Nonvisualization of the ovaries.     Electronically  Signed   By: Charline Bills M.D.   On: 02/13/2023 00:14        GYNECOLOGIC HISTORY: No LMP recorded. (Menstrual status: IUD). Contraception:  IUD-10/07/22 Menopausal hormone therapy:  n/a Last mammogram:  06/30/22 Breast Density Cat C, BI-RADS CAT 3 probably benign Last pap smear:   01/26/23 neg: HR HPV neg, 03/06/21 neg        OB History     Gravida  4   Para  3   Term  3   Preterm  0   AB  1   Living  3      SAB  1   IAB  0   Ectopic  0   Multiple  0   Live Births                 Patient Active Problem List   Diagnosis Date Noted   Vitamin D deficiency 03/19/2017   Iron deficiency anemia 03/19/2017   Simple cyst of right breast 03/19/2017   Upper back pain 08/16/2014   Nevus of left hand 11/15/2012   Hypopigmentation of bilateral forearms 11/17/2011   Health maintenance examination 10/24/2010   GENERALIZED ANXIETY DISORDER 08/28/2008    Past Medical History:  Diagnosis Date   Anemia    Anxiety    Headache, migraine    resolved, none for years   Uterine cyst    Vitamin D deficiency     Past Surgical History:  Procedure Laterality Date   CESAREAN SECTION     INTRAUTERINE DEVICE (  IUD) INSERTION     mirena inserted 10-07-22    Current Outpatient Medications  Medication Sig Dispense Refill   levonorgestrel (MIRENA) 20 MCG/DAY IUD 1 each by Intrauterine route once.     Misc Natural Products (ESTROVEN ENERGY PO) Take by mouth.     Multiple Vitamin (MULTIVITAMIN PO) Take by mouth.     No current facility-administered medications for this visit.     ALLERGIES: Penicillins  Family History  Problem Relation Age of Onset   Heart disease Mother        pacemarker in place   Leukemia Maternal Aunt     Social History   Socioeconomic History   Marital status: Married    Spouse name: Not on file   Number of children: 3   Years of education: Not on file   Highest education level: Not on file  Occupational History   Not on file  Tobacco  Use   Smoking status: Never   Smokeless tobacco: Never  Vaping Use   Vaping status: Never Used  Substance and Sexual Activity   Alcohol use: No   Drug use: No   Sexual activity: Yes    Partners: Male    Birth control/protection: I.U.D.    Comment: 1st intercourse- 20, partners-1  Other Topics Concern   Not on file  Social History Narrative   Financial assistance approved for 100% discount at [facility] and has [facility] card.   Social Determinants of Health   Financial Resource Strain: Not on file  Food Insecurity: No Food Insecurity (12/23/2022)   Hunger Vital Sign    Worried About Running Out of Food in the Last Year: Never true    Ran Out of Food in the Last Year: Never true  Transportation Needs: No Transportation Needs (12/23/2022)   PRAPARE - Administrator, Civil Service (Medical): No    Lack of Transportation (Non-Medical): No  Physical Activity: Not on file  Stress: Not on file  Social Connections: Not on file  Intimate Partner Violence: Not on file    Review of Systems  All other systems reviewed and are negative.   PHYSICAL EXAMINATION:    Ht 5' (1.524 m)   Wt 119 lb (54 kg)   BMI 23.24 kg/m     General appearance: alert, cooperative and appears stated age    ASSESSMENT  Uterine fibroid.  Potential adenomyosis.  Postcoital bleeding.  Nonspecific inflammation noted on pap smear.   PLAN  Pelvic ultrasound findings of uterine fibroid and possible adenomyosis discussed.  IUD in normal position.  Absence of visualization of ovaries discussed as a common outcome with ultrasound.  We reviewed that her IUD treats endometriosis/adenomyosis.  Rx for Doxycycline 100 mg po bid x 1 week due to inflammation on pap.  FSH can be checked to determine menopause when patients have Mirena.  She declines this today.  Questions invited and answered. FU in 6 months for annual exam and prn.   30 min  total time was spent for this patient encounter, including  preparation, face-to-face counseling with the patient, coordination of care, and documentation of the encounter.

## 2023-03-10 ENCOUNTER — Ambulatory Visit: Payer: Commercial Managed Care - HMO | Admitting: Obstetrics & Gynecology

## 2023-03-16 ENCOUNTER — Ambulatory Visit: Payer: Commercial Managed Care - HMO | Admitting: Obstetrics and Gynecology

## 2023-06-10 ENCOUNTER — Ambulatory Visit: Payer: 59 | Admitting: Internal Medicine

## 2023-06-10 ENCOUNTER — Encounter: Payer: Self-pay | Admitting: Internal Medicine

## 2023-06-10 VITALS — BP 100/64 | HR 79 | Temp 98.1°F | Wt 123.9 lb

## 2023-06-10 DIAGNOSIS — M67441 Ganglion, right hand: Secondary | ICD-10-CM

## 2023-06-10 NOTE — Progress Notes (Signed)
     Established Patient Office Visit     CC/Reason for Visit: Cyst on right hand  HPI: Lisa Villarreal is a 48 y.o. female who is coming in today for the above mentioned reasons.  She has noticed this over the past 2 to 3 weeks.  It is on the dorsum of her right hand over her middle finger tendon sheath.  It is not painful, does not cause limitation to her range of motion.   Past Medical/Surgical History: Past Medical History:  Diagnosis Date   Anemia    Anxiety    Headache, migraine    resolved, none for years   Uterine cyst    Vitamin D  deficiency     Past Surgical History:  Procedure Laterality Date   CESAREAN SECTION     INTRAUTERINE DEVICE (IUD) INSERTION     mirena  inserted 10-07-22    Social History:  reports that she has never smoked. She has never used smokeless tobacco. She reports that she does not drink alcohol and does not use drugs.  Allergies: Allergies  Allergen Reactions   Penicillins     REACTION: Swelling    Family History:  Family History  Problem Relation Age of Onset   Heart disease Mother        pacemarker in place   Leukemia Maternal Aunt      Current Outpatient Medications:    levonorgestrel  (MIRENA ) 20 MCG/DAY IUD, 1 each by Intrauterine route once., Disp: , Rfl:    Misc Natural Products (ESTROVEN ENERGY PO), Take by mouth., Disp: , Rfl:    Multiple Vitamin (MULTIVITAMIN PO), Take by mouth., Disp: , Rfl:   Review of Systems:  Negative unless indicated in HPI.   Physical Exam: Vitals:   06/10/23 1555  BP: 100/64  Pulse: 79  Temp: 98.1 F (36.7 C)  TempSrc: Oral  SpO2: 100%  Weight: 123 lb 14.4 oz (56.2 kg)    Body mass index is 24.2 kg/m.   Physical Exam Musculoskeletal:     Comments: Ganglion cyst on dorsum of right hand over third finger tendon sheath      Impression and Plan:  Ganglion cyst of finger of right hand  -Have advised observation for now.  If becomes larger or painful can refer to hand  surgeon.   Time spent:21 minutes reviewing chart, interviewing and examining patient and formulating plan of care.     Tully Theophilus Andrews, MD [facility]

## 2023-08-03 NOTE — Progress Notes (Deleted)
 48 y.o. [id number] Married Hispanic female here for annual exam.    PCP: Philip Aspen, Limmie Patricia, MD   No LMP recorded. (Menstrual status: IUD).           Sexually active: Yes.    The current method of family planning is IUD.    Menopausal hormone therapy:  n/a Exercising: {yes no:314532}  {types:19826} Smoker:  no  OB History  Gravida Para Term Preterm AB Living  4 3 3  0 1 3  SAB IAB Ectopic Multiple Live Births  1 0 0 0     # Outcome Date GA Lbr Len/2nd Weight Sex Type Anes PTL Lv  4 Term 2005     Vag-Spont     3 Term 2003     Vag-Spont     2 Term 1999     CS-Classical     1 SAB              HEALTH MAINTENANCE: Last 2 paps:  01/26/23 neg: HR HPV neg, 03/06/21 neg History of abnormal Pap or positive HPV:  no Mammogram:   06/30/22 Breast density cat C, BI-RADS CAT 3 probably benign Colonoscopy:  04/17/21 Bone Density:  n/a  Result  n/a   Immunization History  Administered Date(s) Administered   Influenza Split 03/20/2011, 06/07/2012   Influenza Whole 06/04/2009   Influenza, Seasonal, Injecte, Preservative Fre 12/31/2022   Influenza,inj,Quad PF,6+ Mos 02/18/2013, 01/26/2015, 01/21/2016, 01/09/2017, 03/01/2019, 03/01/2020, 03/06/2021, 03/07/2022   PFIZER(Purple Top)SARS-COV-2 Vaccination 08/04/2019, 09/03/2019, 04/09/2020   Tdap 01/26/2015      reports that she has never smoked. She has never used smokeless tobacco. She reports that she does not drink alcohol and does not use drugs.  Past Medical History:  Diagnosis Date   Anemia    Anxiety    Headache, migraine    resolved, none for years   Uterine cyst    Vitamin D deficiency     Past Surgical History:  Procedure Laterality Date   CESAREAN SECTION     INTRAUTERINE DEVICE (IUD) INSERTION     mirena inserted 10-07-22    Current Outpatient Medications  Medication Sig Dispense Refill   levonorgestrel (MIRENA) 20 MCG/DAY IUD 1 each by Intrauterine route once.     Misc Natural Products (ESTROVEN ENERGY PO)  Take by mouth.     Multiple Vitamin (MULTIVITAMIN PO) Take by mouth.     No current facility-administered medications for this visit.    ALLERGIES: Penicillins  Family History  Problem Relation Age of Onset   Heart disease Mother        pacemarker in place   Leukemia Maternal Aunt     Review of Systems  PHYSICAL EXAM:  There were no vitals taken for this visit.    General appearance: alert, cooperative and appears stated age Head: normocephalic, without obvious abnormality, atraumatic Neck: no adenopathy, supple, symmetrical, trachea midline and thyroid normal to inspection and palpation Lungs: clear to auscultation bilaterally Breasts: normal appearance, no masses or tenderness, No nipple retraction or dimpling, No nipple discharge or bleeding, No axillary adenopathy Heart: regular rate and rhythm Abdomen: soft, non-tender; no masses, no organomegaly Extremities: extremities normal, atraumatic, no cyanosis or edema Skin: skin color, texture, turgor normal. No rashes or lesions Lymph nodes: cervical, supraclavicular, and axillary nodes normal. Neurologic: grossly normal  Pelvic: External genitalia:  no lesions              No abnormal inguinal nodes palpated.  Urethra:  normal appearing urethra with no masses, tenderness or lesions              Bartholins and Skenes: normal                 Vagina: normal appearing vagina with normal color and discharge, no lesions              Cervix: no lesions              Pap taken: {yes no:314532} Bimanual Exam:  Uterus:  normal size, contour, position, consistency, mobility, non-tender              Adnexa: no mass, fullness, tenderness              Rectal exam: {yes no:314532}.  Confirms.              Anus:  normal sphincter tone, no lesions  Chaperone was present for exam:  {BSCHAPERONE:31226::"Lita  F, CMA"}  ASSESSMENT: Well woman visit with gynecologic exam.  PHQ-9: ***  ***  PLAN: Mammogram screening  discussed. Self breast awareness reviewed. Pap and HRV collected:  {yes no:314532} Guidelines for Calcium, Vitamin D, regular exercise program including cardiovascular and weight bearing exercise. Medication refills:  *** {LABS (Optional):23779} Follow up:  ***    Additional counseling given.  {yes [id number]. ***  total time was spent for this patient encounter, including preparation, face-to-face counseling with the patient, coordination of care, and documentation of the encounter in addition to doing the well woman visit with gynecologic exam.

## 2023-08-17 ENCOUNTER — Ambulatory Visit: Payer: Commercial Managed Care - HMO | Admitting: Obstetrics and Gynecology

## 2023-10-01 ENCOUNTER — Encounter: Payer: Self-pay | Admitting: Obstetrics and Gynecology

## 2023-10-01 ENCOUNTER — Ambulatory Visit (INDEPENDENT_AMBULATORY_CARE_PROVIDER_SITE_OTHER): Admitting: Obstetrics and Gynecology

## 2023-10-01 VITALS — BP 90/62 | HR 70 | Temp 97.8°F | Ht 59.75 in | Wt 121.0 lb

## 2023-10-01 DIAGNOSIS — R300 Dysuria: Secondary | ICD-10-CM

## 2023-10-01 DIAGNOSIS — N930 Postcoital and contact bleeding: Secondary | ICD-10-CM

## 2023-10-01 DIAGNOSIS — N952 Postmenopausal atrophic vaginitis: Secondary | ICD-10-CM | POA: Insufficient documentation

## 2023-10-01 DIAGNOSIS — D219 Benign neoplasm of connective and other soft tissue, unspecified: Secondary | ICD-10-CM

## 2023-10-01 DIAGNOSIS — Z01419 Encounter for gynecological examination (general) (routine) without abnormal findings: Secondary | ICD-10-CM | POA: Diagnosis not present

## 2023-10-01 DIAGNOSIS — Z1331 Encounter for screening for depression: Secondary | ICD-10-CM

## 2023-10-01 DIAGNOSIS — Z975 Presence of (intrauterine) contraceptive device: Secondary | ICD-10-CM

## 2023-10-01 MED ORDER — ESTRADIOL 0.1 MG/GM VA CREA
TOPICAL_CREAM | VAGINAL | 1 refills | Status: AC
Start: 2023-10-01 — End: ?

## 2023-10-01 NOTE — Assessment & Plan Note (Signed)
Reviewed safety profile of low dose vaginal estrogen, however reviewed that higher doses have been associated with DVT, breast and uterine cancer.

## 2023-10-01 NOTE — Progress Notes (Signed)
 48 y.o. [id number] female with Mirena  (inserted 10/07/22), ?adenomyosis, fibroids here for annual exam. Married. Due to language barrier, an interpreter was present during the history-taking and subsequent discussion (and for part of the physical exam) with this patient.  No LMP recorded. (Menstrual status: IUD).   PCB previously declined vaginal estrogen, using estroven. Burning with urination x3d. Happens a few times a year. Usually resolves on its own.  Bleeding and pain with IC at times since Mirena  IUD insertion  Abnormal bleeding: no cycle with Mirena  Pelvic discharge or pain: only with intercourse Breast mass, nipple discharge or skin changes : Left breast pain or nodule following biopsy, thinks its from scar tissue  Birth control: Mirena  Last PAP:     Component Value Date/Time   DIAGPAP  01/26/2023 1647    - Negative for intraepithelial lesion or malignancy (NILM)   DIAGPAP  03/06/2021 1656    - Negative for intraepithelial lesion or malignancy (NILM)   HPVHIGH Negative 01/26/2023 1647   ADEQPAP  01/26/2023 1647    Satisfactory for evaluation; transformation zone component ABSENT.   ADEQPAP  03/06/2021 1656    Satisfactory for evaluation; transformation zone component PRESENT.   Last mammogram: 12/23/22 BIRADS 4, s/p biopsy: LEFT axilla, BENIGN BREAST TISSUE AND BENIGN ADIPOSE TISSUE. Last colonoscopy: 04/17/21 Sexually active: yes  Exercising: yes, running Smoker: no  Garment/textile technologist Visit from 10/01/2023 in [facility]  PHQ-2 Total Score 0       Flowsheet Row Office Visit from 12/31/2022 in [facility] [place]  PHQ-9 Total Score 1       GYN HISTORY: Fibroids ?Adenomyosis, 2025 TVUS  OB History  Gravida Para Term Preterm AB Living  4 3 3  0 1 3  SAB IAB Ectopic Multiple Live Births  1 0 0 0     # Outcome Date GA Lbr Len/2nd Weight Sex Type Anes PTL Lv  4 Term 2005     Vag-Spont     3 Term 2003      Vag-Spont     2 Term 1999     CS-Classical     1 SAB            Past Medical History:  Diagnosis Date   Anemia    Anxiety    Headache, migraine    resolved, none for years   Uterine cyst    Vitamin D  deficiency    Past Surgical History:  Procedure Laterality Date   CESAREAN SECTION     INTRAUTERINE DEVICE (IUD) INSERTION     mirena  inserted 10-07-22   Current Outpatient Medications on File Prior to Visit  Medication Sig Dispense Refill   levonorgestrel  (MIRENA ) 20 MCG/DAY IUD 1 each by Intrauterine route once.     No current facility-administered medications on file prior to visit.   Social History   Socioeconomic History   Marital status: Married    Spouse name: Not on file   Number of children: 3   Years of education: Not on file   Highest education level: Not on file  Occupational History   Not on file  Tobacco Use   Smoking status: Never   Smokeless tobacco: Never  Vaping Use   Vaping status: Never Used  Substance and Sexual Activity   Alcohol use: No   Drug use: No   Sexual activity: Yes    Partners: Male    Birth control/protection: I.U.D.    Comment: 1st intercourse- 20, partners-1  Other Topics Concern   Not on file  Social History Narrative   Financial assistance approved for 100% discount at [facility] and has [facility] card.   Social Drivers of Corporate investment banker Strain: Not on file  Food Insecurity: No Food Insecurity (12/23/2022)   Hunger Vital Sign    Worried About Running Out of Food in the Last Year: Never true    Ran Out of Food in the Last Year: Never true  Transportation Needs: No Transportation Needs (12/23/2022)   PRAPARE - Administrator, Civil Service (Medical): No    Lack of Transportation (Non-Medical): No  Physical Activity: Not on file  Stress: Not on file  Social Connections: Not on file  Intimate Partner Violence: Not on file   Family History  Problem Relation Age of Onset   Heart disease Mother        pacemarker  in place   Leukemia Maternal Aunt    Allergies  Allergen Reactions   Penicillins     REACTION: Swelling     PE Today's Vitals   10/01/23 0804  BP: 90/62  Pulse: 70  Temp: 97.8 F (36.6 C)  TempSrc: Oral  SpO2: 98%  Weight: 121 lb (54.9 kg)  Height: 4' 11.75" (1.518 m)   Body mass index is 23.83 kg/m.  Physical Exam Vitals reviewed. Exam conducted with a chaperone present.  Constitutional:      General: She is not in acute distress.    Appearance: Normal appearance.  HENT:     Head: Normocephalic and atraumatic.     Nose: Nose normal.  Eyes:     Extraocular Movements: Extraocular movements intact.     Conjunctiva/sclera: Conjunctivae normal.  Neck:     Thyroid: No thyroid mass, thyromegaly or thyroid tenderness.  Pulmonary:     Effort: Pulmonary effort is normal.  Chest:     Chest wall: No mass or tenderness.  Breasts:    Right: Normal. No swelling, mass, nipple discharge, skin change or tenderness.     Left: Normal. No swelling, mass, nipple discharge, skin change or tenderness.       Comments: Site of prior biopsy Abdominal:     General: There is no distension.     Palpations: Abdomen is soft.     Tenderness: There is no abdominal tenderness.  Genitourinary:    General: Normal vulva.     Exam position: Lithotomy position.     Urethra: No prolapse.     Vagina: Normal. No vaginal discharge or bleeding.     Cervix: Normal. No cervical motion tenderness, discharge or lesion.     Uterus: Normal. Not enlarged and not tender.      Adnexa: Right adnexa normal and left adnexa normal.     Comments: IUD strings present. Musculoskeletal:        General: Normal range of motion.     Cervical back: Normal range of motion.  Lymphadenopathy:     Upper Body:     Right upper body: No axillary adenopathy.     Left upper body: No axillary adenopathy.     Lower Body: No right inguinal adenopathy. No left inguinal adenopathy.  Skin:    General: Skin is warm and dry.   Neurological:     General: No focal deficit present.     Mental Status: She is alert.  Psychiatric:        Mood and Affect: Mood normal.        Behavior: Behavior  normal.       Assessment and Plan:        Well woman exam with routine gynecological exam Assessment & Plan: Cervical cancer screening performed according to ASCCP guidelines. Encouraged annual mammogram screening Colonoscopy UTD DXA N/A Labs and immunizations with her primary Encouraged safe sexual practices as indicated Encouraged healthy lifestyle practices with diet and exercise For patients under 50yo, I recommend 1000mg  calcium daily and 600IU of vitamin D  daily.    Burning with urination -     Urinalysis,Complete w/RFL Culture  Fibroids Uses hormone releasing intrauterine device (IUD) for contraception Bleeding well controlled with IUD, continue for 5-81yr  Postcoital bleeding -     Estradiol ; Apply 1/2 gram to vulva nightly for 2 weeks then decrease to 1/2 gram to vulva two nights a week. Do not use applicator.  Dispense: 42.5 g; Refill: 1  Vaginal atrophy Assessment & Plan: Reviewed safety profile of low dose vaginal estrogen, however reviewed that higher doses have been associated with DVT, breast and uterine cancer.    Orders: -     Estradiol ; Apply 1/2 gram to vulva nightly for 2 weeks then decrease to 1/2 gram to vulva two nights a week. Do not use applicator.  Dispense: 42.5 g; Refill: 1   Romaine Closs, MD

## 2023-10-01 NOTE — Assessment & Plan Note (Signed)
 Cervical cancer screening performed according to ASCCP guidelines. Encouraged annual mammogram screening Colonoscopy UTD DXA N/A Labs and immunizations with her primary Encouraged safe sexual practices as indicated Encouraged healthy lifestyle practices with diet and exercise For patients under 50yo, I recommend 1000mg  calcium daily and 600IU of vitamin D daily.

## 2023-10-01 NOTE — Patient Instructions (Signed)

## 2023-10-03 LAB — URINALYSIS, COMPLETE W/RFL CULTURE
Bacteria, UA: NONE SEEN /HPF
Bilirubin Urine: NEGATIVE
Glucose, UA: NEGATIVE
Hyaline Cast: NONE SEEN /LPF
Ketones, ur: NEGATIVE
Leukocyte Esterase: NEGATIVE
Nitrites, Initial: NEGATIVE
Protein, ur: NEGATIVE
Specific Gravity, Urine: 1.01 (ref 1.001–1.035)
pH: 6 (ref 5.0–8.0)

## 2023-10-03 LAB — URINE CULTURE
MICRO NUMBER:: 16514056
Result:: NO GROWTH
SPECIMEN QUALITY:: ADEQUATE

## 2023-10-03 LAB — CULTURE INDICATED

## 2023-10-05 ENCOUNTER — Ambulatory Visit: Payer: Self-pay | Admitting: Obstetrics and Gynecology

## 2023-11-16 ENCOUNTER — Other Ambulatory Visit: Payer: Self-pay

## 2023-11-16 DIAGNOSIS — R921 Mammographic calcification found on diagnostic imaging of breast: Secondary | ICD-10-CM

## 2023-11-16 DIAGNOSIS — Z9889 Other specified postprocedural states: Secondary | ICD-10-CM

## 2024-01-19 ENCOUNTER — Ambulatory Visit: Admitting: *Deleted

## 2024-01-19 ENCOUNTER — Ambulatory Visit
Admission: RE | Admit: 2024-01-19 | Discharge: 2024-01-19 | Disposition: A | Discharge status: Home / Self Care | Source: Ambulatory Visit | Attending: Obstetrics and Gynecology | Admitting: Obstetrics and Gynecology

## 2024-01-19 ENCOUNTER — Ambulatory Visit

## 2024-01-19 ENCOUNTER — Other Ambulatory Visit

## 2024-01-19 ENCOUNTER — Encounter

## 2024-01-19 VITALS — BP 122/57 | Wt 121.0 lb

## 2024-01-19 DIAGNOSIS — Z1239 Encounter for other screening for malignant neoplasm of breast: Secondary | ICD-10-CM

## 2024-01-19 DIAGNOSIS — Z9889 Other specified postprocedural states: Secondary | ICD-10-CM

## 2024-01-19 DIAGNOSIS — R921 Mammographic calcification found on diagnostic imaging of breast: Secondary | ICD-10-CM

## 2024-01-19 NOTE — Progress Notes (Signed)
 [name] is a 48 y.o. female who presents to [facility] clinic today with complaint of pain at left breast biopsy site that started 5 months after biopsy 12/25/2022 when she lies down. Patient rates the pain at a 7 out of 10. Patient complained of a left breast lump since prior to her last mammogram. Patient had a left axillary biopsy 12/25/2022 that a bilateral diagnostic mammogram was recommended for follow up in 1 year.   Pap Smear: Pap smear not completed today. Last Pap smear was 01/26/2023 at [facility] clinic and was normal with negative HPV. Per patient has no history of an abnormal Pap smear. Last Pap smear result is available in Epic.   Physical exam: Breasts Breasts symmetrical. No skin abnormalities bilateral breasts. No nipple retraction bilateral breasts. No nipple discharge bilateral breasts. No lymphadenopathy. No lumps palpated bilateral breasts. No complaints of pain or tenderness on exam.   MS 3D DIAG MAMMO BILAT BR (aka MM) Result Date: 01/19/2024 CLINICAL DATA:  48 year old female presenting for annual exam in 2 year follow-up of probably benign right breast calcifications. Patient had a benign left breast biopsy in 20. No problems. EXAM: DIGITAL DIAGNOSTIC BILATERAL MAMMOGRAM WITH TOMOSYNTHESIS AND CAD TECHNIQUE: Bilateral digital diagnostic mammography and breast tomosynthesis was performed. The images were evaluated with computer-aided detection. COMPARISON:  Previous exam(s). ACR Breast Density Category c: The breasts are heterogeneously dense, which may obscure small masses. FINDINGS: Right breast: Spot 2D magnification views of the right breast performed in addition to standard views. There is a faint tiny with calcifications the outer right breast which appear stable to decreased compared to original magnification views from 2023. No new suspicious linear or branching forms. No new suspicious findings elsewhere in the right breast. Left breast: No suspicious mass,  distortion, or microcalcifications are identified to suggest presence of malignancy. IMPRESSION: 1. Stable to slightly decreased small group of calcifications in the outer right breast which given stability over 2 years are considered benign. 2. No mammographic evidence of malignancy bilaterally. RECOMMENDATION: Screening mammogram in one year.(Code:SM-B-01Y) I have discussed the findings and recommendations with the patient. If applicable, a reminder letter will be sent to the patient regarding the next appointment. BI-RADS CATEGORY  2: Benign. Electronically Signed   By: Inocente Ast M.D.   On: 01/19/2024 10:40   MS 3D DIAG MAMMO BILAT BR (aka MM) Result Date: 12/23/2022 CLINICAL DATA:  One year follow-up for probably benign calcifications in the RIGHT breast. The patient reports a palpable mass in the LOWER LEFT axilla. EXAM: DIGITAL DIAGNOSTIC BILATERAL MAMMOGRAM WITH TOMOSYNTHESIS AND CAD; US  AXILLARY LEFT TECHNIQUE: Bilateral digital diagnostic mammography and breast tomosynthesis was performed. The images were evaluated with computer-aided detection. ; Targeted ultrasound examination of the left axilla was performed. COMPARISON:  Previous exam(s). ACR Breast Density Category c: The breasts are heterogeneously dense, which may obscure small masses. FINDINGS: RIGHT breast: Magnified views are performed of calcifications in the LATERAL portion of the RIGHT breast. These views demonstrate a stable 2 millimeter group of calcifications with coarse dystrophic morphology. No new or suspicious findings in the RIGHT breast. LEFT breast: No suspicious mass, distortion, or microcalcifications are identified to suggest presence of malignancy. On physical exam, I palpate discrete focal thickening in the area of patient's concern, in the LOWER LEFT axilla. Targeted LEFT breast ultrasound is performed, showing subtle heterogeneous non mass finding associated with posterior acoustic shadowing in the LOWER LEFT axilla  corresponding to the area of concern. This region measures  0.8 x 0.4 x 0.6 centimeters. There is no associated internal blood flow on Doppler evaluation. Evaluation of the lymph nodes show lymph nodes with normal morphology. IMPRESSION: 1. Stable probably benign calcifications in the RIGHT breast. 2. Subtle 0.8 centimeter heterogeneous non mass finding in the area of a palpable abnormality in the LOWER LEFT axilla. 3. No LEFT axillary adenopathy. RECOMMENDATION: Ultrasound-guided core biopsy of palpable LEFT breast non mass finding. I have discussed the findings and recommendations with the patient. If applicable, a reminder letter will be sent to the patient regarding the next appointment. BI-RADS CATEGORY  4: Suspicious. Electronically Signed   By: Almarie Daring M.D.   On: 12/23/2022 15:13   MM DIAG BREAST TOMO UNI RIGHT Result Date: 06/30/2022 CLINICAL DATA:  48 year old female presenting for follow-up of likely benign right breast calcifications. EXAM: DIGITAL DIAGNOSTIC UNILATERAL RIGHT MAMMOGRAM WITH TOMOSYNTHESIS TECHNIQUE: Right digital diagnostic mammography and breast tomosynthesis was performed. COMPARISON:  Previous exam(s). ACR Breast Density Category c: The breasts are heterogeneously dense, which may obscure small masses. FINDINGS: The 4 mm group of calcifications in the lateral mid depth the right breast are mammographically stable. No new suspicious calcifications, masses or areas of distortion are seen in the right breast. IMPRESSION: Stable likely benign right breast calcifications. RECOMMENDATION: Bilateral diagnostic mammogram in August of 2024. I have discussed the findings and recommendations with the patient. If applicable, a reminder letter will be sent to the patient regarding the next appointment. BI-RADS CATEGORY  3: Probably benign. Electronically Signed   By: Rosaline Collet M.D.   On: 06/30/2022 08:28  MM DIAG BREAST TOMO BILATERAL Result Date: 12/26/2021 CLINICAL DATA:   48 year old female for further evaluation of RIGHT breast calcifications and possible LEFT breast mass on screening mammogram. Patient also now complains of palpable thickening within the UPPER-OUTER LEFT breast. EXAM: DIGITAL DIAGNOSTIC BILATERAL MAMMOGRAM WITH TOMOSYNTHESIS; ULTRASOUND LEFT BREAST LIMITED TECHNIQUE: Bilateral digital diagnostic mammography and breast tomosynthesis was performed.; Targeted ultrasound examination of the left breast was performed. COMPARISON:  Previous exam(s). ACR Breast Density Category c: The breast tissue is heterogeneously dense, which may obscure small masses. FINDINGS: Full field views of both breasts, magnification views of the RIGHT breast and spot compression views of the LEFT breast demonstrate a 0.4 cm group of calcifications within the OUTER RIGHT breast, middle depth, which appear to layer on the full field LATERAL views. A persistent circumscribed oval mass within the central LEFT breast is identified. Targeted ultrasound is performed, showing a 0.9 x 0.7 x 1 cm benign cyst at the 3 o'clock position of the LEFT breast 1 cm from the nipple the, corresponding to the screening study finding. No sonographic abnormalities identified within the UPPER-OUTER LEFT breast, at the site of patient concern. IMPRESSION: 1. Likely benign 0.4 cm group of OUTER RIGHT breast calcifications, likely milk of calcium. Six-month follow-up recommended to ensure stability. 2. Benign cyst within the OUTER central LEFT breast corresponding to the screening study finding. RECOMMENDATION: RIGHT diagnostic mammogram with magnification views in 6 months. I have discussed the findings and recommendations with the patient. If applicable, a reminder letter will be sent to the patient regarding the next appointment. BI-RADS CATEGORY  3: Probably benign. Electronically Signed   By: Reyes Phi M.D.   On: 12/26/2021 09:41  MM 3D SCREEN BREAST BILATERAL Result Date: 12/03/2021 CLINICAL DATA:  Screening.  EXAM: DIGITAL SCREENING BILATERAL MAMMOGRAM WITH TOMOSYNTHESIS AND CAD TECHNIQUE: Bilateral screening digital craniocaudal and mediolateral oblique mammograms were obtained. Bilateral screening digital  breast tomosynthesis was performed. The images were evaluated with computer-aided detection. COMPARISON:  Previous exam(s). ACR Breast Density Category c: The breast tissue is heterogeneously dense, which may obscure small masses. FINDINGS: In the right breast calcifications require further evaluation. In the left breast possible mass requires further evaluation. IMPRESSION: Further evaluation is suggested for calcifications in the right breast. Further evaluation is suggested for possible mass in the left breast. RECOMMENDATION: Diagnostic mammogram and possibly ultrasound of both breasts. (Code:FI-B-92M) The patient will be contacted regarding the findings, and additional imaging will be scheduled. BI-RADS CATEGORY  0: Incomplete. Need additional imaging evaluation and/or prior mammograms for comparison. Electronically Signed   By: Almarie Daring M.D.   On: 12/03/2021 13:18   MM 3D SCREEN BREAST BILATERAL Result Date: 11/30/2020 CLINICAL DATA:  Screening. EXAM: DIGITAL SCREENING BILATERAL MAMMOGRAM WITH TOMOSYNTHESIS AND CAD TECHNIQUE: Bilateral screening digital craniocaudal and mediolateral oblique mammograms were obtained. Bilateral screening digital breast tomosynthesis was performed. The images were evaluated with computer-aided detection. COMPARISON:  Previous exam(s). ACR Breast Density Category c: The breast tissue is heterogeneously dense, which may obscure small masses. FINDINGS: There are no findings suspicious for malignancy. IMPRESSION: No mammographic evidence of malignancy. A result letter of this screening mammogram will be mailed directly to the patient. RECOMMENDATION: Screening mammogram in one year. (Code:[id number]-B-01Y) BI-RADS CATEGORY  1: Negative. Electronically Signed   By: Dobrinka   Dimitrova M.D.   On: 11/30/2020 16:38   MM 3D SCREEN BREAST BILATERAL Result Date: 11/09/2019 CLINICAL DATA:  Screening. EXAM: DIGITAL SCREENING BILATERAL MAMMOGRAM WITH TOMO AND CAD COMPARISON:  Previous exam(s). ACR Breast Density Category c: The breast tissue is heterogeneously dense, which may obscure small masses. FINDINGS: There are no findings suspicious for malignancy. Images were processed with CAD. IMPRESSION: No mammographic evidence of malignancy. A result letter of this screening mammogram will be mailed directly to the patient. RECOMMENDATION: Screening mammogram in one year. (Code:SM-B-01Y) BI-RADS CATEGORY  1: Negative. Electronically Signed   By: Bard Moats M.D.   On: 11/09/2019 07:38   Pelvic/Bimanual Pap is not indicated today per BCCCP guidelines.   Smoking History: Patient has never smoked.   Patient Navigation: Patient education provided. Access to services provided for patient through [place] program. Spanish interpreter Lisa Villarreal from [facility] provided.  Colorectal Cancer Screening: Per patient has had colonoscopy completed on 12/25/2022. No complaints today.    Breast and Cervical Cancer Risk Assessment: Patient does not have family history of breast cancer, known genetic mutations, or radiation treatment to the chest before age 28. Patient does not have history of cervical dysplasia, immunocompromised, or DES exposure in-utero.  Risk Scores as of Encounter on 01/19/2024     Lisa Villarreal           5-year 0.94%   Lifetime 9.85%            Last calculated by Lisa Villarreal, Lisa Villarreal, CMA on 01/19/2024 at  9:25 AM        A: BCCCP exam without pap smear Complaint of left breast pain and lump.  P: Referred patient to the [facility] for a diagnostic mammogram per recommendation. Appointment scheduled Tuesday, January 19, 2024 at 1030.  Lisa Wanda SQUIBB, RN 01/19/2024 9:28 AM

## 2024-01-19 NOTE — Patient Instructions (Signed)
 Explained breast self awareness with [facility]. Patient did not need a Pap smear today due to last Pap smear and HPV typing was 01/26/2023. Let her know BCCCP will cover Pap smears and HPV typing every 5 years unless has a history of abnormal Pap smears. Referred patient to the [facility] for a diagnostic mammogram per recommendation. Appointment scheduled Tuesday, January 19, 2024 at 1030. Patient aware of appointment and will be there. [name] verbalized understanding.  Teirra Carapia, Wanda Ship, RN 9:28 AM

## 2024-01-31 ENCOUNTER — Encounter: Payer: Self-pay | Admitting: Obstetrics and Gynecology

## 2024-02-02 ENCOUNTER — Encounter: Admitting: Internal Medicine

## 2024-02-02 NOTE — Progress Notes (Signed)
 This encounter was created in error - please disregard.

## 2024-02-09 ENCOUNTER — Ambulatory Visit (INDEPENDENT_AMBULATORY_CARE_PROVIDER_SITE_OTHER): Admitting: Internal Medicine

## 2024-02-09 ENCOUNTER — Ambulatory Visit: Payer: Self-pay | Admitting: Internal Medicine

## 2024-02-09 ENCOUNTER — Encounter: Payer: Self-pay | Admitting: Internal Medicine

## 2024-02-09 VITALS — BP 110/70 | HR 70 | Temp 98.3°F | Ht 60.0 in | Wt 122.1 lb

## 2024-02-09 DIAGNOSIS — E559 Vitamin D deficiency, unspecified: Secondary | ICD-10-CM

## 2024-02-09 DIAGNOSIS — D509 Iron deficiency anemia, unspecified: Secondary | ICD-10-CM

## 2024-02-09 DIAGNOSIS — H539 Unspecified visual disturbance: Secondary | ICD-10-CM

## 2024-02-09 DIAGNOSIS — Z23 Encounter for immunization: Secondary | ICD-10-CM

## 2024-02-09 DIAGNOSIS — Z0000 Encounter for general adult medical examination without abnormal findings: Secondary | ICD-10-CM | POA: Diagnosis not present

## 2024-02-09 LAB — CBC WITH DIFFERENTIAL/PLATELET
Basophils Absolute: 0 K/uL (ref 0.0–0.1)
Basophils Relative: 0.3 % (ref 0.0–3.0)
Eosinophils Absolute: 0.2 K/uL (ref 0.0–0.7)
Eosinophils Relative: 2.2 % (ref 0.0–5.0)
HCT: 37.8 % (ref 36.0–46.0)
Hemoglobin: 12.6 g/dL (ref 12.0–15.0)
Lymphocytes Relative: 33.1 % (ref 12.0–46.0)
Lymphs Abs: 2.4 K/uL (ref 0.7–4.0)
MCHC: 33.5 g/dL (ref 30.0–36.0)
MCV: 84.5 fl (ref 78.0–100.0)
Monocytes Absolute: 0.4 K/uL (ref 0.1–1.0)
Monocytes Relative: 6 % (ref 3.0–12.0)
Neutro Abs: 4.2 K/uL (ref 1.4–7.7)
Neutrophils Relative %: 58.4 % (ref 43.0–77.0)
Platelets: 288 K/uL (ref 150.0–400.0)
RBC: 4.47 Mil/uL (ref 3.87–5.11)
RDW: 13.9 % (ref 11.5–15.5)
WBC: 7.2 K/uL (ref 4.0–10.5)

## 2024-02-09 LAB — COMPREHENSIVE METABOLIC PANEL WITH GFR
ALT: 14 U/L (ref 0–35)
AST: 15 U/L (ref 0–37)
Albumin: 4.5 g/dL (ref 3.5–5.2)
Alkaline Phosphatase: 56 U/L (ref 39–117)
BUN: 11 mg/dL (ref 6–23)
CO2: 26 meq/L (ref 19–32)
Calcium: 9.6 mg/dL (ref 8.4–10.5)
Chloride: 105 meq/L (ref 96–112)
Creatinine, Ser: 0.66 mg/dL (ref 0.40–1.20)
GFR: 103.19 mL/min (ref >60.00)
Glucose, Bld: 102 mg/dL — ABNORMAL HIGH (ref 70–99)
Potassium: 3.9 meq/L (ref 3.5–5.1)
Sodium: 139 meq/L (ref 135–145)
Total Bilirubin: 0.6 mg/dL (ref 0.2–1.2)
Total Protein: 7.2 g/dL (ref 6.0–8.3)

## 2024-02-09 LAB — LIPID PANEL
Cholesterol: 151 mg/dL (ref 0–200)
HDL: 52.9 mg/dL (ref >39.00)
LDL Cholesterol: 76 mg/dL (ref 0–99)
NonHDL: 98.45
Total CHOL/HDL Ratio: 3
Triglycerides: 113 mg/dL (ref 0.0–149.0)
VLDL: 22.6 mg/dL (ref 0.0–40.0)

## 2024-02-09 LAB — IBC + FERRITIN
Ferritin: 40.7 ng/mL (ref 10.0–291.0)
Iron: 107 ug/dL (ref 42–145)
Saturation Ratios: 30.7 % (ref 20.0–50.0)
TIBC: 348.6 ug/dL (ref 250.0–450.0)
Transferrin: 249 mg/dL (ref 212.0–360.0)

## 2024-02-09 LAB — VITAMIN D 25 HYDROXY (VIT D DEFICIENCY, FRACTURES): VITD: 17.28 ng/mL — ABNORMAL LOW (ref 30.00–100.00)

## 2024-02-09 LAB — VITAMIN B12: Vitamin B-12: 371 pg/mL (ref 211–911)

## 2024-02-09 LAB — TSH: TSH: 2.9 u[IU]/mL (ref 0.35–5.50)

## 2024-02-09 MED ORDER — VITAMIN D (ERGOCALCIFEROL) 1.25 MG (50000 UNIT) PO CAPS: 50000.0000 [IU] | ORAL_CAPSULE | ORAL | 0 refills | Status: AC

## 2024-02-09 NOTE — Progress Notes (Signed)
 Established Patient Office Visit     CC/Reason for Visit: Annual preventive exam  HPI: Lisa Villarreal is a 48 y.o. female who is coming in today for the above mentioned reasons. Past Medical History is significant for: Vitamin D  deficiency and iron deficiency anemia.  Feeling well without major concerns or complaints.  Is overdue for an eye exam.  All cancer screening is up-to-date.  Due for flu and COVID vaccines.   Past Medical/Surgical History: Past Medical History:  Diagnosis Date   Anemia    Anxiety    Headache, migraine    resolved, none for years   Uterine cyst    Vitamin D  deficiency     Past Surgical History:  Procedure Laterality Date   CESAREAN SECTION     INTRAUTERINE DEVICE (IUD) INSERTION     mirena  inserted 10-07-22    Social History:  reports that she has never smoked. She has never used smokeless tobacco. She reports that she does not drink alcohol and does not use drugs.  Allergies: Allergies  Allergen Reactions   Penicillins     REACTION: Swelling    Family History:  Family History  Problem Relation Age of Onset   Heart disease Mother        pacemarker in place   Leukemia Maternal Aunt      Current Outpatient Medications:    Cyanocobalamin  (VITAMIN B12 PO), Take by mouth., Disp: , Rfl:    estradiol  (ESTRACE  VAGINAL) 0.1 MG/GM vaginal cream, Apply 1/2 gram to vulva nightly for 2 weeks then decrease to 1/2 gram to vulva two nights a week. Do not use applicator., Disp: 42.5 g, Rfl: 1   levonorgestrel  (MIRENA ) 20 MCG/DAY IUD, 1 each by Intrauterine route once., Disp: , Rfl:    MAGNESIUM PO, Take by mouth., Disp: , Rfl:    Nutritional Supplements (ESTROVEN PO), Take by mouth., Disp: , Rfl:   Review of Systems:  Negative unless indicated in HPI.   Physical Exam: Vitals:   02/09/24 0830  BP: 110/70  Pulse: 70  Temp: 98.3 F (36.8 C)  TempSrc: Oral  SpO2: 97%  Weight: 122 lb 1.6 oz (55.4 kg)  Height: 5' (1.524 m)    Body  mass index is 23.85 kg/m.   Physical Exam Vitals reviewed.  Constitutional:      General: She is not in acute distress.    Appearance: Normal appearance. She is not ill-appearing, toxic-appearing or diaphoretic.  HENT:     Head: Normocephalic.     Right Ear: Tympanic membrane, ear canal and external ear normal. There is no impacted cerumen.     Left Ear: Tympanic membrane, ear canal and external ear normal. There is no impacted cerumen.     Nose: Nose normal.     Mouth/Throat:     Mouth: Mucous membranes are moist.     Pharynx: Oropharynx is clear. No oropharyngeal exudate or posterior oropharyngeal erythema.  Eyes:     General: No scleral icterus.       Right eye: No discharge.        Left eye: No discharge.     Conjunctiva/sclera: Conjunctivae normal.     Pupils: Pupils are equal, round, and reactive to light.  Neck:     Vascular: No carotid bruit.  Cardiovascular:     Rate and Rhythm: Normal rate and regular rhythm.     Pulses: Normal pulses.     Heart sounds: Normal heart sounds.  Pulmonary:  Effort: Pulmonary effort is normal. No respiratory distress.     Breath sounds: Normal breath sounds.  Abdominal:     General: Abdomen is flat. Bowel sounds are normal.     Palpations: Abdomen is soft.  Musculoskeletal:        General: Normal range of motion.     Cervical back: Normal range of motion.  Skin:    General: Skin is warm and dry.  Neurological:     General: No focal deficit present.     Mental Status: She is alert and oriented to person, place, and time. Mental status is at baseline.  Psychiatric:        Mood and Affect: Mood normal.        Behavior: Behavior normal.        Thought Content: Thought content normal.        Judgment: Judgment normal.     Flowsheet Row Office Visit from 02/09/2024 in [facility] [place]  PHQ-9 Total Score 1     Impression and Plan:  Encounter for preventive health examination -     Comprehensive  metabolic panel with GFR; Future -     Lipid panel; Future -     TSH; Future -     Vitamin B12; Future  Vitamin D  deficiency -     VITAMIN D  25 Hydroxy (Vit-D Deficiency, Fractures); Future  Iron deficiency anemia, unspecified iron deficiency anemia type -     CBC with Differential/Platelet; Future -     IBC + Ferritin; Future  Immunization due  Vision changes -     Ambulatory referral to Ophthalmology  -Recommend routine eye and dental care. -Healthy lifestyle discussed in detail. -Labs to be updated today. -Prostate cancer screening: Not applicable Health Maintenance  Topic Date Due   Hepatitis B Vaccine (1 of 3 - 19+ 3-dose series) Never done   COVID-19 Vaccine (4 - 2025-26 season) 01/04/2024   Flu Shot  08/02/2024*   Breast Cancer Screening  01/18/2026   DTaP/Tdap/Td vaccine (3 - Td or Tdap) 12/31/2026   Pap with HPV screening  01/26/2028   Colon Cancer Screening  04/18/2031   Hepatitis C Screening  Completed   HIV Screening  Completed   Pneumococcal Vaccine  Aged Out   HPV Vaccine  Aged Out   Meningitis B Vaccine  Aged Out  *Topic was postponed. The date shown is not the original due date.      - Flu vaccine in office today.  Will obtain COVID-vaccine at pharmacy. - All cancer screening is up-to-date. - Referral placed for ophthalmology.    Tully Theophilus Andrews, MD [facility]

## 2024-02-09 NOTE — Addendum Note (Signed)
 Addended by: KATHRYNE MILLMAN B on: 02/09/2024 08:50 AM   Modules accepted: Orders

## 2024-02-18 ENCOUNTER — Telehealth: Payer: Self-pay | Admitting: Internal Medicine

## 2024-02-18 NOTE — Telephone Encounter (Signed)
 Copied from CRM [id number]. Topic: Referral - Question >> Feb 18, 2024 11:56 AM Adelita E wrote: Reason for CRM: [facility] with Dr. Craige office (ophthalmology) called in stating that they are not in-network with patient's insurance. Questioning if this referral could be send to a facility that is.

## 2024-02-18 NOTE — Telephone Encounter (Signed)
 Sent to [facility]

## 2024-05-10 ENCOUNTER — Encounter: Admitting: Internal Medicine

## 2024-10-03 ENCOUNTER — Ambulatory Visit: Admitting: Obstetrics and Gynecology
